# Patient Record
Sex: Male | Born: 1940 | Race: White | Hispanic: No | Marital: Married | Smoking: Former smoker
Health system: Southern US, Community
[De-identification: ages and names within clinical notes are randomized; demographics above are authoritative.]

## PROBLEM LIST (undated history)

## (undated) DIAGNOSIS — F419 Anxiety disorder, unspecified: Secondary | ICD-10-CM

## (undated) DIAGNOSIS — I739 Peripheral vascular disease, unspecified: Secondary | ICD-10-CM

## (undated) DIAGNOSIS — I209 Angina pectoris, unspecified: Secondary | ICD-10-CM

## (undated) DIAGNOSIS — E7800 Pure hypercholesterolemia, unspecified: Secondary | ICD-10-CM

## (undated) DIAGNOSIS — C801 Malignant (primary) neoplasm, unspecified: Secondary | ICD-10-CM

## (undated) DIAGNOSIS — IMO0001 Reserved for inherently not codable concepts without codable children: Secondary | ICD-10-CM

## (undated) DIAGNOSIS — H9190 Unspecified hearing loss, unspecified ear: Secondary | ICD-10-CM

## (undated) DIAGNOSIS — E119 Type 2 diabetes mellitus without complications: Secondary | ICD-10-CM

## (undated) DIAGNOSIS — I10 Essential (primary) hypertension: Secondary | ICD-10-CM

## (undated) DIAGNOSIS — I2510 Atherosclerotic heart disease of native coronary artery without angina pectoris: Secondary | ICD-10-CM

## (undated) HISTORY — PX: MOUTH SURGERY: SHX715

## (undated) HISTORY — PX: LEG TENDON SURGERY: SHX1004

## (undated) HISTORY — PX: APPENDECTOMY: SHX54

## (undated) HISTORY — PX: CARDIAC CATHETERIZATION: SHX172

## (undated) HISTORY — PX: HERNIA REPAIR: SHX51

---

## 2006-08-25 ENCOUNTER — Ambulatory Visit: Payer: Self-pay | Admitting: Internal Medicine

## 2015-04-11 DIAGNOSIS — H2513 Age-related nuclear cataract, bilateral: Secondary | ICD-10-CM | POA: Diagnosis not present

## 2015-06-05 DIAGNOSIS — Z85828 Personal history of other malignant neoplasm of skin: Secondary | ICD-10-CM | POA: Diagnosis not present

## 2015-06-05 DIAGNOSIS — L821 Other seborrheic keratosis: Secondary | ICD-10-CM | POA: Diagnosis not present

## 2015-06-05 DIAGNOSIS — D225 Melanocytic nevi of trunk: Secondary | ICD-10-CM | POA: Diagnosis not present

## 2015-06-05 DIAGNOSIS — Z08 Encounter for follow-up examination after completed treatment for malignant neoplasm: Secondary | ICD-10-CM | POA: Diagnosis not present

## 2015-06-05 DIAGNOSIS — B351 Tinea unguium: Secondary | ICD-10-CM | POA: Diagnosis not present

## 2015-06-05 DIAGNOSIS — L700 Acne vulgaris: Secondary | ICD-10-CM | POA: Diagnosis not present

## 2015-06-05 DIAGNOSIS — L219 Seborrheic dermatitis, unspecified: Secondary | ICD-10-CM | POA: Diagnosis not present

## 2015-06-05 DIAGNOSIS — H2512 Age-related nuclear cataract, left eye: Secondary | ICD-10-CM | POA: Diagnosis not present

## 2015-06-10 ENCOUNTER — Encounter: Payer: Self-pay | Admitting: *Deleted

## 2015-06-17 ENCOUNTER — Encounter
Admission: RE | Disposition: A | Discharge status: Home / Self Care | Payer: Self-pay | Source: Ambulatory Visit | Attending: Ophthalmology

## 2015-06-17 ENCOUNTER — Ambulatory Visit: Payer: Medicare Other | Admitting: Anesthesiology

## 2015-06-17 ENCOUNTER — Encounter: Payer: Self-pay | Admitting: *Deleted

## 2015-06-17 ENCOUNTER — Ambulatory Visit
Admission: RE | Admit: 2015-06-17 | Discharge: 2015-06-17 | Disposition: A | Discharge status: Home / Self Care | Payer: Medicare Other | Source: Ambulatory Visit | Attending: Ophthalmology | Admitting: Ophthalmology

## 2015-06-17 DIAGNOSIS — F419 Anxiety disorder, unspecified: Secondary | ICD-10-CM | POA: Diagnosis not present

## 2015-06-17 DIAGNOSIS — Z87891 Personal history of nicotine dependence: Secondary | ICD-10-CM | POA: Diagnosis not present

## 2015-06-17 DIAGNOSIS — E7800 Pure hypercholesterolemia, unspecified: Secondary | ICD-10-CM | POA: Insufficient documentation

## 2015-06-17 DIAGNOSIS — E119 Type 2 diabetes mellitus without complications: Secondary | ICD-10-CM | POA: Diagnosis not present

## 2015-06-17 DIAGNOSIS — I10 Essential (primary) hypertension: Secondary | ICD-10-CM | POA: Diagnosis not present

## 2015-06-17 DIAGNOSIS — H9193 Unspecified hearing loss, bilateral: Secondary | ICD-10-CM | POA: Diagnosis not present

## 2015-06-17 DIAGNOSIS — I739 Peripheral vascular disease, unspecified: Secondary | ICD-10-CM | POA: Diagnosis not present

## 2015-06-17 DIAGNOSIS — H2512 Age-related nuclear cataract, left eye: Secondary | ICD-10-CM | POA: Insufficient documentation

## 2015-06-17 DIAGNOSIS — I2510 Atherosclerotic heart disease of native coronary artery without angina pectoris: Secondary | ICD-10-CM | POA: Insufficient documentation

## 2015-06-17 HISTORY — DX: Peripheral vascular disease, unspecified: I73.9

## 2015-06-17 HISTORY — DX: Atherosclerotic heart disease of native coronary artery without angina pectoris: I25.10

## 2015-06-17 HISTORY — DX: Unspecified hearing loss, unspecified ear: H91.90

## 2015-06-17 HISTORY — DX: Malignant (primary) neoplasm, unspecified: C80.1

## 2015-06-17 HISTORY — DX: Anxiety disorder, unspecified: F41.9

## 2015-06-17 HISTORY — DX: Pure hypercholesterolemia, unspecified: E78.00

## 2015-06-17 HISTORY — DX: Essential (primary) hypertension: I10

## 2015-06-17 HISTORY — DX: Angina pectoris, unspecified: I20.9

## 2015-06-17 HISTORY — DX: Reserved for inherently not codable concepts without codable children: IMO0001

## 2015-06-17 HISTORY — DX: Type 2 diabetes mellitus without complications: E11.9

## 2015-06-17 HISTORY — PX: CATARACT EXTRACTION W/PHACO: SHX586

## 2015-06-17 LAB — GLUCOSE, CAPILLARY: GLUCOSE-CAPILLARY: 96 mg/dL (ref 65–99)

## 2015-06-17 SURGERY — PHACOEMULSIFICATION, CATARACT, WITH IOL INSERTION
Anesthesia: Monitor Anesthesia Care | Site: Eye | Laterality: Left | Wound class: Clean

## 2015-06-17 MED ORDER — POVIDONE-IODINE 5 % OP SOLN
1.0000 "application " | Freq: Once | OPHTHALMIC | Status: AC
  Administered 2015-06-17: 1 via OPHTHALMIC

## 2015-06-17 MED ORDER — POVIDONE-IODINE 5 % OP SOLN
OPHTHALMIC | Status: AC
  Filled 2015-06-17: qty 30

## 2015-06-17 MED ORDER — CARBACHOL 0.01 % IO SOLN
INTRAOCULAR | Status: DC | PRN
  Administered 2015-06-17: 0.5 mL via INTRAOCULAR

## 2015-06-17 MED ORDER — MOXIFLOXACIN HCL 0.5 % OP SOLN
OPHTHALMIC | Status: AC
  Filled 2015-06-17: qty 3

## 2015-06-17 MED ORDER — ARMC OPHTHALMIC DILATING GEL
1.0000 "application " | Freq: Once | OPHTHALMIC | Status: AC
  Administered 2015-06-17: 1 via OPHTHALMIC

## 2015-06-17 MED ORDER — NA CHONDROIT SULF-NA HYALURON 40-17 MG/ML IO SOLN
INTRAOCULAR | Status: AC
  Filled 2015-06-17: qty 1

## 2015-06-17 MED ORDER — MIDAZOLAM HCL 2 MG/2ML IJ SOLN
INTRAMUSCULAR | Status: DC | PRN
  Administered 2015-06-17: 1 mg via INTRAVENOUS

## 2015-06-17 MED ORDER — EPINEPHRINE HCL 1 MG/ML IJ SOLN
INTRAMUSCULAR | Status: AC
  Filled 2015-06-17: qty 1

## 2015-06-17 MED ORDER — FENTANYL CITRATE (PF) 100 MCG/2ML IJ SOLN
INTRAMUSCULAR | Status: DC | PRN
  Administered 2015-06-17: 50 ug via INTRAVENOUS

## 2015-06-17 MED ORDER — CEFUROXIME OPHTHALMIC INJECTION 1 MG/0.1 ML
INJECTION | OPHTHALMIC | Status: AC
  Filled 2015-06-17: qty 0.1

## 2015-06-17 MED ORDER — NA CHONDROIT SULF-NA HYALURON 40-17 MG/ML IO SOLN
INTRAOCULAR | Status: DC | PRN
  Administered 2015-06-17: 1 mL via INTRAOCULAR

## 2015-06-17 MED ORDER — CEFUROXIME OPHTHALMIC INJECTION 1 MG/0.1 ML
INJECTION | OPHTHALMIC | Status: DC | PRN
  Administered 2015-06-17: 0.1 mL via INTRACAMERAL

## 2015-06-17 MED ORDER — EPINEPHRINE HCL 1 MG/ML IJ SOLN
INTRAOCULAR | Status: DC | PRN
  Administered 2015-06-17: 09:00:00 via OPHTHALMIC

## 2015-06-17 MED ORDER — TETRACAINE HCL 0.5 % OP SOLN
OPHTHALMIC | Status: AC
  Filled 2015-06-17: qty 2

## 2015-06-17 MED ORDER — SODIUM CHLORIDE 0.9 % IV SOLN
INTRAVENOUS | Status: DC
  Administered 2015-06-17 (×2): via INTRAVENOUS

## 2015-06-17 MED ORDER — TETRACAINE HCL 0.5 % OP SOLN
1.0000 [drp] | Freq: Once | OPHTHALMIC | Status: AC
  Administered 2015-06-17: 1 [drp] via OPHTHALMIC

## 2015-06-17 MED ORDER — MOXIFLOXACIN HCL 0.5 % OP SOLN
OPHTHALMIC | Status: DC | PRN
  Administered 2015-06-17: 1 [drp] via OPHTHALMIC

## 2015-06-17 MED ORDER — MOXIFLOXACIN HCL 0.5 % OP SOLN: 1.0000 [drp] | OPHTHALMIC | Status: DC | PRN

## 2015-06-17 SURGICAL SUPPLY — 21 items
CANNULA ANT/CHMB 27GA (MISCELLANEOUS) ×3 IMPLANT
CUP MEDICINE 2OZ PLAST GRAD ST (MISCELLANEOUS) ×3 IMPLANT
GLOVE BIO SURGEON STRL SZ8 (GLOVE) ×3 IMPLANT
GLOVE BIOGEL M 6.5 STRL (GLOVE) ×3 IMPLANT
GLOVE SURG LX 8.0 MICRO (GLOVE) ×2
GLOVE SURG LX STRL 8.0 MICRO (GLOVE) ×1 IMPLANT
GOWN STRL REUS W/ TWL LRG LVL3 (GOWN DISPOSABLE) ×2 IMPLANT
GOWN STRL REUS W/TWL LRG LVL3 (GOWN DISPOSABLE) ×4
LENS IOL TECNIS ITEC 21.0 (Intraocular Lens) ×3 IMPLANT
PACK CATARACT (MISCELLANEOUS) ×3 IMPLANT
PACK CATARACT BRASINGTON LX (MISCELLANEOUS) ×3 IMPLANT
PACK EYE AFTER SURG (MISCELLANEOUS) ×3 IMPLANT
SOL BSS BAG (MISCELLANEOUS) ×3
SOL PREP PVP 2OZ (MISCELLANEOUS) ×3
SOLUTION BSS BAG (MISCELLANEOUS) ×1 IMPLANT
SOLUTION PREP PVP 2OZ (MISCELLANEOUS) ×1 IMPLANT
SYR 3ML LL SCALE MARK (SYRINGE) ×3 IMPLANT
SYR 5ML LL (SYRINGE) ×3 IMPLANT
SYR TB 1ML 27GX1/2 LL (SYRINGE) ×3 IMPLANT
WATER STERILE IRR 1000ML POUR (IV SOLUTION) ×3 IMPLANT
WIPE NON LINTING 3.25X3.25 (MISCELLANEOUS) ×3 IMPLANT

## 2015-06-17 NOTE — Op Note (Signed)
PREOPERATIVE DIAGNOSIS:  Nuclear sclerotic cataract of the left eye.   POSTOPERATIVE DIAGNOSIS:  NUCLEAR SCLEROTIC CATARACT LEFT EYE   OPERATIVE PROCEDURE:  Procedure(s): CATARACT EXTRACTION PHACO AND INTRAOCULAR LENS PLACEMENT (IOC)   SURGEON:  Birder Robson, MD.   ANESTHESIA:   Anesthesiologist: Andria Frames, MD CRNA: Jenetta Downer, CRNA; Aline Brochure, CRNA  1.      Managed anesthesia care. 2.      Topical tetracaine drops followed by 2% Xylocaine jelly applied in the preoperative holding area.   COMPLICATIONS:  None.   TECHNIQUE:   Stop and chop   DESCRIPTION OF PROCEDURE:  The patient was examined and consented in the preoperative holding area where the aforementioned topical anesthesia was applied to the left eye and then brought back to the Operating Room where the left eye was prepped and draped in the usual sterile ophthalmic fashion and a lid speculum was placed. A paracentesis was created with the side port blade and the anterior chamber was filled with viscoelastic. A near clear corneal incision was performed with the steel keratome. A continuous curvilinear capsulorrhexis was performed with a cystotome followed by the capsulorrhexis forceps. Hydrodissection and hydrodelineation were carried out with BSS on a blunt cannula. The lens was removed in a stop and chop  technique and the remaining cortical material was removed with the irrigation-aspiration handpiece. The capsular bag was inflated with viscoelastic and the Technis [id number] lens was placed in the capsular bag without complication. The remaining viscoelastic was removed from the eye with the irrigation-aspiration handpiece. The wounds were hydrated. The anterior chamber was flushed with Miostat and the eye was inflated to physiologic pressure. 0.1 mL of cefuroxime concentration 10 mg/mL was placed in the anterior chamber. The wounds were found to be water tight. The eye was dressed with Vigamox. The  patient was given protective glasses to wear throughout the day and a shield with which to sleep tonight. The patient was also given drops with which to begin a drop regimen today and will follow-up with me in one day.  Implant Name Type Inv. Item Serial No. Manufacturer Lot No. LRB No. Used  Aspheric IOL     [id number] [facility]   Left 1   Procedure(s) with comments: CATARACT EXTRACTION PHACO AND INTRAOCULAR LENS PLACEMENT (IOC) (Left) - [place] 47.5 AP% 20.9 CDE 9.92 Fluid Pack lot # [id number]  Electronically signed: [place] 06/17/2015 9:13 AM

## 2015-06-17 NOTE — Anesthesia Postprocedure Evaluation (Signed)
Anesthesia Post Note  Patient: Aaron HOLZWORTH Sr.  Procedure(s) Performed: Procedure(s) (LRB): CATARACT EXTRACTION PHACO AND INTRAOCULAR LENS PLACEMENT (IOC) (Left)  Patient location during evaluation: PACU Anesthesia Type: MAC Level of consciousness: awake and alert and oriented Pain management: pain level controlled Vital Signs Assessment: post-procedure vital signs reviewed and stable Respiratory status: spontaneous breathing Cardiovascular status: stable Anesthetic complications: no    Last Vitals:  Filed Vitals:   06/17/15 0745 06/17/15 0914  BP: 131/76   Pulse: 67 59  Temp: 37.1 C 36.6 C  Resp: 16 16    Last Pain: There were no vitals filed for this visit.               Estill Batten

## 2015-06-17 NOTE — Transfer of Care (Signed)
Immediate Anesthesia Transfer of Care Note  Patient: Aaron Garcia.  Procedure(s) Performed: Procedure(s) with comments: CATARACT EXTRACTION PHACO AND INTRAOCULAR LENS PLACEMENT (IOC) (Left) - [place] 47.5 AP% 20.9 CDE 9.92 Fluid Pack lot # [id number]  Patient Location: PACU  Anesthesia Type:MAC  Level of Consciousness: awake, alert  and oriented  Airway & Oxygen Therapy: Patient Spontanous Breathing  Post-op Assessment: Report given to RN  Post vital signs: Reviewed and stable  Last Vitals:  Filed Vitals:   06/17/15 0745  BP: 131/76  Pulse: 67  Temp: 37.1 C  Resp: 16    Last Pain: There were no vitals filed for this visit.       Complications: No apparent anesthesia complications

## 2015-06-17 NOTE — Discharge Instructions (Signed)
Eye Surgery Discharge Instructions  Expect mild scratchy sensation or mild soreness. DO NOT RUB YOUR EYE!  The day of surgery:  Minimal physical activity, but bed rest is not required  No reading, computer work, or close hand work  No bending, lifting, or straining.  May watch TV  For 24 hours:  No driving, legal decisions, or alcoholic beverages  Safety precautions  Eat anything you prefer: It is better to start with liquids, then soup then solid foods.  _____ Eye patch should be worn until postoperative exam tomorrow.  ____ Solar shield eyeglasses should be worn for comfort in the sunlight/patch while sleeping  Resume all regular medications including aspirin or Coumadin if these were discontinued prior to surgery. You may shower, bathe, shave, or wash your hair. Tylenol may be taken for mild discomfort.  Call your doctor if you experience significant pain, nausea, or vomiting, fever > 101 or other signs of infection. [phone] or [phone] Specific instructions:  Follow-up Information    Follow up with Tim Lair, MD On 06/18/2015.   Specialty:  Ophthalmology   Why:  [address] information:   [address] [place] [address] [phone]

## 2015-06-17 NOTE — H&P (Signed)
  All labs reviewed. Abnormal studies sent to patients PCP when indicated.  Previous H&P reviewed, patient examined, there are NO CHANGES.  Aaron Garcia LOUIS5/30/20178:46 AM

## 2015-06-17 NOTE — Anesthesia Preprocedure Evaluation (Signed)
Anesthesia Evaluation  Patient identified by MRN, date of birth, ID band Patient awake    Reviewed: Allergy & Precautions, H&P , NPO status , Patient's Chart, lab work & pertinent test results  History of Anesthesia Complications Negative for: history of anesthetic complications  Airway Mallampati: III  TM Distance: >3 FB Neck ROM: limited    Dental  (+) Poor Dentition, Chipped, Missing, Upper Dentures   Pulmonary shortness of breath and with exertion, former smoker,    Pulmonary exam normal breath sounds clear to auscultation       Cardiovascular Exercise Tolerance: Poor hypertension, + angina + CAD, + Peripheral Vascular Disease and + DOE  Normal cardiovascular examII Rhythm:regular Rate:Normal     Neuro/Psych negative neurological ROS  negative psych ROS   GI/Hepatic negative GI ROS, Neg liver ROS, neg GERD  ,  Endo/Other  diabetes, Type 2  Renal/GU negative Renal ROS  negative genitourinary   Musculoskeletal   Abdominal   Peds  Hematology negative hematology ROS (+)   Anesthesia Other Findings Past Medical History:   Coronary artery disease                                      Hypertension                                                 Hypercholesteremia                                           Cancer (HCC)                                                   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   Peripheral vascular disease (HCC)                            Diabetes mellitus without complication (HCC)                 HOH (hard of hearing)                                          Comment:wears hearing aids   Anxiety                                                      Anginal pain (HCC)                                           Shortness of breath dyspnea  Comment:orthopnea uses 2 pillows  Past Surgical History:   CARDIAC CATHETERIZATION                                      Comment:No Stents   HERNIA REPAIR                                                 APPENDECTOMY                                                  MOUTH SURGERY                                                 LEG TENDON SURGERY                                           BMI    Body Mass Index   27.97 kg/m 2      Reproductive/Obstetrics negative OB ROS                             Anesthesia Physical Anesthesia Plan  ASA: III  Anesthesia Plan: MAC   Post-op Pain Management:    Induction:   Airway Management Planned:   Additional Equipment:   Intra-op Plan:   Post-operative Plan:   Informed Consent: I have reviewed the patients History and Physical, chart, labs and discussed the procedure including the risks, benefits and alternatives for the proposed anesthesia with the patient or authorized representative who has indicated his/her understanding and acceptance.   Dental Advisory Given  Plan Discussed with: Anesthesiologist, CRNA and Surgeon  Anesthesia Plan Comments:         Anesthesia Quick Evaluation

## 2015-07-07 DIAGNOSIS — H2511 Age-related nuclear cataract, right eye: Secondary | ICD-10-CM | POA: Diagnosis not present

## 2015-07-08 ENCOUNTER — Encounter: Payer: Self-pay | Admitting: *Deleted

## 2015-07-15 ENCOUNTER — Ambulatory Visit
Admission: RE | Admit: 2015-07-15 | Discharge: 2015-07-15 | Disposition: A | Discharge status: Home / Self Care | Payer: Medicare Other | Source: Ambulatory Visit | Attending: Ophthalmology | Admitting: Ophthalmology

## 2015-07-15 ENCOUNTER — Ambulatory Visit: Payer: Medicare Other | Admitting: Anesthesiology

## 2015-07-15 ENCOUNTER — Encounter: Payer: Self-pay | Admitting: *Deleted

## 2015-07-15 ENCOUNTER — Encounter
Admission: RE | Disposition: A | Discharge status: Home / Self Care | Payer: Self-pay | Source: Ambulatory Visit | Attending: Ophthalmology

## 2015-07-15 DIAGNOSIS — F419 Anxiety disorder, unspecified: Secondary | ICD-10-CM | POA: Diagnosis not present

## 2015-07-15 DIAGNOSIS — I739 Peripheral vascular disease, unspecified: Secondary | ICD-10-CM | POA: Insufficient documentation

## 2015-07-15 DIAGNOSIS — E119 Type 2 diabetes mellitus without complications: Secondary | ICD-10-CM | POA: Insufficient documentation

## 2015-07-15 DIAGNOSIS — I10 Essential (primary) hypertension: Secondary | ICD-10-CM | POA: Diagnosis not present

## 2015-07-15 DIAGNOSIS — H2511 Age-related nuclear cataract, right eye: Secondary | ICD-10-CM | POA: Insufficient documentation

## 2015-07-15 DIAGNOSIS — Z87891 Personal history of nicotine dependence: Secondary | ICD-10-CM | POA: Diagnosis not present

## 2015-07-15 DIAGNOSIS — I2510 Atherosclerotic heart disease of native coronary artery without angina pectoris: Secondary | ICD-10-CM | POA: Insufficient documentation

## 2015-07-15 DIAGNOSIS — R0602 Shortness of breath: Secondary | ICD-10-CM | POA: Diagnosis not present

## 2015-07-15 HISTORY — PX: CATARACT EXTRACTION W/PHACO: SHX586

## 2015-07-15 SURGERY — PHACOEMULSIFICATION, CATARACT, WITH IOL INSERTION
Anesthesia: Monitor Anesthesia Care | Site: Eye | Laterality: Right | Wound class: Clean

## 2015-07-15 MED ORDER — CEFUROXIME OPHTHALMIC INJECTION 1 MG/0.1 ML
INJECTION | OPHTHALMIC | Status: AC
  Filled 2015-07-15: qty 0.1

## 2015-07-15 MED ORDER — CEFUROXIME OPHTHALMIC INJECTION 1 MG/0.1 ML
INJECTION | OPHTHALMIC | Status: DC | PRN
  Administered 2015-07-15: .1 mL via INTRACAMERAL

## 2015-07-15 MED ORDER — TETRACAINE HCL 0.5 % OP SOLN
1.0000 [drp] | Freq: Once | OPHTHALMIC | Status: AC
  Administered 2015-07-15: 1 [drp] via OPHTHALMIC

## 2015-07-15 MED ORDER — POVIDONE-IODINE 5 % OP SOLN
OPHTHALMIC | Status: AC
  Filled 2015-07-15: qty 30

## 2015-07-15 MED ORDER — ALFENTANIL 500 MCG/ML IJ INJ
INJECTION | INTRAMUSCULAR | Status: DC | PRN
  Administered 2015-07-15: 500 ug via INTRAVENOUS

## 2015-07-15 MED ORDER — EPINEPHRINE HCL 1 MG/ML IJ SOLN
INTRAMUSCULAR | Status: AC
  Filled 2015-07-15: qty 1

## 2015-07-15 MED ORDER — CARBACHOL 0.01 % IO SOLN
INTRAOCULAR | Status: DC | PRN
  Administered 2015-07-15: .5 mL via INTRAOCULAR

## 2015-07-15 MED ORDER — NA CHONDROIT SULF-NA HYALURON 40-17 MG/ML IO SOLN
INTRAOCULAR | Status: DC | PRN
  Administered 2015-07-15: 1 mL via INTRAOCULAR

## 2015-07-15 MED ORDER — MOXIFLOXACIN HCL 0.5 % OP SOLN: 1.0000 [drp] | OPHTHALMIC | Status: DC | PRN

## 2015-07-15 MED ORDER — TETRACAINE HCL 0.5 % OP SOLN
OPHTHALMIC | Status: AC
  Filled 2015-07-15: qty 2

## 2015-07-15 MED ORDER — MIDAZOLAM HCL 2 MG/2ML IJ SOLN
INTRAMUSCULAR | Status: DC | PRN
  Administered 2015-07-15 (×2): 0.5 mg via INTRAVENOUS

## 2015-07-15 MED ORDER — MOXIFLOXACIN HCL 0.5 % OP SOLN
OPHTHALMIC | Status: AC
  Filled 2015-07-15: qty 3

## 2015-07-15 MED ORDER — POVIDONE-IODINE 5 % OP SOLN
OPHTHALMIC | Status: DC | PRN
  Administered 2015-07-15: 1 via OPHTHALMIC

## 2015-07-15 MED ORDER — SODIUM CHLORIDE 0.9 % IV SOLN
INTRAVENOUS | Status: DC
Start: 2015-07-15 — End: 2015-07-15
  Administered 2015-07-15: 10:00:00 via INTRAVENOUS

## 2015-07-15 MED ORDER — BSS IO SOLN
INTRAOCULAR | Status: DC | PRN
  Administered 2015-07-15: 1 mL via OPHTHALMIC

## 2015-07-15 MED ORDER — ARMC OPHTHALMIC DILATING GEL
OPHTHALMIC | Status: AC
  Filled 2015-07-15: qty 0.25

## 2015-07-15 MED ORDER — ARMC OPHTHALMIC DILATING GEL
1.0000 "application " | OPHTHALMIC | Status: AC
  Administered 2015-07-15: 1 via OPHTHALMIC

## 2015-07-15 MED ORDER — POVIDONE-IODINE 5 % OP SOLN
1.0000 "application " | Freq: Once | OPHTHALMIC | Status: AC
  Administered 2015-07-15: 1 via OPHTHALMIC

## 2015-07-15 MED ORDER — MOXIFLOXACIN HCL 0.5 % OP SOLN
OPHTHALMIC | Status: DC | PRN
  Administered 2015-07-15: 1 [drp] via OPHTHALMIC

## 2015-07-15 MED ORDER — NA CHONDROIT SULF-NA HYALURON 40-17 MG/ML IO SOLN
INTRAOCULAR | Status: AC
Start: 2015-07-15 — End: 2015-07-15
  Filled 2015-07-15: qty 1

## 2015-07-15 SURGICAL SUPPLY — 21 items
CANNULA ANT/CHMB 27GA (MISCELLANEOUS) ×3 IMPLANT
CUP MEDICINE 2OZ PLAST GRAD ST (MISCELLANEOUS) ×3 IMPLANT
GLOVE BIO SURGEON STRL SZ8 (GLOVE) ×3 IMPLANT
GLOVE BIOGEL M 6.5 STRL (GLOVE) ×3 IMPLANT
GLOVE SURG LX 8.0 MICRO (GLOVE) ×2
GLOVE SURG LX STRL 8.0 MICRO (GLOVE) ×1 IMPLANT
GOWN STRL REUS W/ TWL LRG LVL3 (GOWN DISPOSABLE) ×2 IMPLANT
GOWN STRL REUS W/TWL LRG LVL3 (GOWN DISPOSABLE) ×4
LENS IOL TECNIS ITEC 20.0 (Intraocular Lens) ×3 IMPLANT
PACK CATARACT (MISCELLANEOUS) ×3 IMPLANT
PACK CATARACT BRASINGTON LX (MISCELLANEOUS) ×3 IMPLANT
PACK EYE AFTER SURG (MISCELLANEOUS) ×3 IMPLANT
SOL BSS BAG (MISCELLANEOUS) ×3
SOL PREP PVP 2OZ (MISCELLANEOUS) ×3
SOLUTION BSS BAG (MISCELLANEOUS) ×1 IMPLANT
SOLUTION PREP PVP 2OZ (MISCELLANEOUS) ×1 IMPLANT
SYR 3ML LL SCALE MARK (SYRINGE) ×3 IMPLANT
SYR 5ML LL (SYRINGE) ×3 IMPLANT
SYR TB 1ML 27GX1/2 LL (SYRINGE) ×3 IMPLANT
WATER STERILE IRR 1000ML POUR (IV SOLUTION) ×3 IMPLANT
WIPE NON LINTING 3.25X3.25 (MISCELLANEOUS) ×3 IMPLANT

## 2015-07-15 NOTE — Discharge Instructions (Signed)
Eye Surgery Discharge Instructions  Expect mild scratchy sensation or mild soreness. DO NOT RUB YOUR EYE!  The day of surgery:  Minimal physical activity, but bed rest is not required  No reading, computer work, or close hand work  No bending, lifting, or straining.  May watch TV  For 24 hours:  No driving, legal decisions, or alcoholic beverages  Safety precautions  Eat anything you prefer: It is better to start with liquids, then soup then solid foods.  _____ Eye patch should be worn until postoperative exam tomorrow.  ____ Solar shield eyeglasses should be worn for comfort in the sunlight/patch while sleeping  Resume all regular medications including aspirin or Coumadin if these were discontinued prior to surgery. You may shower, bathe, shave, or wash your hair. Tylenol may be taken for mild discomfort.  Call your doctor if you experience significant pain, nausea, or vomiting, fever > 101 or other signs of infection. [phone] or [phone] Specific instructions:  Follow-up Information    Follow up with Tim Lair, MD On 07/16/2015.   Specialty:  Ophthalmology   Why:  10:55   Contact information:   [address] [place] [address] [phone]

## 2015-07-15 NOTE — Anesthesia Postprocedure Evaluation (Signed)
Anesthesia Post Note  Patient: Aaron PERDEW Sr.  Procedure(s) Performed: Procedure(s) (LRB): CATARACT EXTRACTION PHACO AND INTRAOCULAR LENS PLACEMENT (IOC) (Right)  Patient location during evaluation: Other Anesthesia Type: General Level of consciousness: awake and alert Pain management: pain level controlled Vital Signs Assessment: post-procedure vital signs reviewed and stable Respiratory status: spontaneous breathing, nonlabored ventilation, respiratory function stable and patient connected to nasal cannula oxygen Cardiovascular status: blood pressure returned to baseline and stable Postop Assessment: no signs of nausea or vomiting Anesthetic complications: no    Last Vitals:  Filed Vitals:   07/15/15 1022 07/15/15 1034  BP: 118/63 110/60  Pulse: 60 62  Temp: 36.5 C   Resp:  16    Last Pain: There were no vitals filed for this visit.               Tzivia Oneil S

## 2015-07-15 NOTE — H&P (Signed)
  All labs reviewed. Abnormal studies sent to patients PCP when indicated.  Previous H&P reviewed, patient examined, there are NO CHANGES.  Joni Colegrove LOUIS6/27/20179:54 AM

## 2015-07-15 NOTE — Op Note (Signed)
PREOPERATIVE DIAGNOSIS:  Nuclear sclerotic cataract of the right eye.   POSTOPERATIVE DIAGNOSIS: nuclear sclerotic cataract right eye   OPERATIVE PROCEDURE:  Procedure(s): CATARACT EXTRACTION PHACO AND INTRAOCULAR LENS PLACEMENT (IOC)   SURGEON:  Birder Robson, MD.   ANESTHESIA:  Anesthesiologist: Gunnar Bulla, MD CRNA: Courtney Paris, CRNA  1.      Managed anesthesia care. 2.      Topical tetracaine drops followed by 2% Xylocaine jelly applied in the preoperative holding area.   COMPLICATIONS:  None.   TECHNIQUE:   Stop and chop   DESCRIPTION OF PROCEDURE:  The patient was examined and consented in the preoperative holding area where the aforementioned topical anesthesia was applied to the right eye and then brought back to the Operating Room where the right eye was prepped and draped in the usual sterile ophthalmic fashion and a lid speculum was placed. A paracentesis was created with the side port blade and the anterior chamber was filled with viscoelastic. A near clear corneal incision was performed with the steel keratome. A continuous curvilinear capsulorrhexis was performed with a cystotome followed by the capsulorrhexis forceps. Hydrodissection and hydrodelineation were carried out with BSS on a blunt cannula. The lens was removed in a stop and chop  technique and the remaining cortical material was removed with the irrigation-aspiration handpiece. The capsular bag was inflated with viscoelastic and the Technis [id number]  lens was placed in the capsular bag without complication. The remaining viscoelastic was removed from the eye with the irrigation-aspiration handpiece. The wounds were hydrated. The anterior chamber was flushed with Miostat and the eye was inflated to physiologic pressure. 0.1 mL of cefuroxime concentration 10 mg/mL was placed in the anterior chamber. The wounds were found to be water tight. The eye was dressed with Vigamox. The patient was given protective glasses to  wear throughout the day and a shield with which to sleep tonight. The patient was also given drops with which to begin a drop regimen today and will follow-up with me in one day.  Implant Name Type Inv. Item Serial No. Manufacturer Lot No. LRB No. Used  LENS IOL DIOP 20.0 - [id number] Intraocular Lens LENS IOL DIOP 20.0 [id number]   Right 1   Procedure(s) with comments: CATARACT EXTRACTION PHACO AND INTRAOCULAR LENS PLACEMENT (IOC) (Right) - [place] 01:06 AP% 19.5 CDE 13.04 fluid pack lot # [id number]  Electronically signed: [place] 07/15/2015 10:19 AM

## 2015-07-15 NOTE — Transfer of Care (Signed)
Immediate Anesthesia Transfer of Care Note  Patient: Aaron BECHERER Sr.  Procedure(s) Performed: Procedure(s) with comments: CATARACT EXTRACTION PHACO AND INTRAOCULAR LENS PLACEMENT (IOC) (Right) - [place] 01:06 AP% 19.5 CDE 13.04 fluid pack lot # [id number]  Patient Location: PACU and Short Stay  Anesthesia Type:MAC  Level of Consciousness: awake, oriented and patient cooperative  Airway & Oxygen Therapy: Patient Spontanous Breathing  Post-op Assessment: Report given to RN and Post -op Vital signs reviewed and stable  Post vital signs: stable  Last Vitals:  Filed Vitals:   07/15/15 0842 07/15/15 0847  BP: 121/70   Pulse: 69   Temp:  35.9 C  Resp: 16     Last Pain: There were no vitals filed for this visit.       Complications: No apparent anesthesia complications

## 2015-07-15 NOTE — Anesthesia Preprocedure Evaluation (Addendum)
Anesthesia Evaluation  Patient identified by MRN, date of birth, ID band Patient awake    Reviewed: Allergy & Precautions, NPO status , Patient's Chart, lab work & pertinent test results, reviewed documented beta blocker date and time   Airway Mallampati: II  TM Distance: >3 FB     Dental  (+) Chipped   Pulmonary shortness of breath, former smoker,           Cardiovascular hypertension, Pt. on medications and Pt. on home beta blockers + angina + CAD and + Peripheral Vascular Disease       Neuro/Psych Anxiety    GI/Hepatic   Endo/Other  diabetes, Type 2  Renal/GU      Musculoskeletal   Abdominal   Peds  Hematology   Anesthesia Other Findings Active man, does not take NTG. No MIs. No stents. Dentures at bottom can be removed, upper is fixed.  Reproductive/Obstetrics                          Anesthesia Physical Anesthesia Plan  ASA: III  Anesthesia Plan: MAC   Post-op Pain Management:    Induction:   Airway Management Planned:   Additional Equipment:   Intra-op Plan:   Post-operative Plan:   Informed Consent: I have reviewed the patients History and Physical, chart, labs and discussed the procedure including the risks, benefits and alternatives for the proposed anesthesia with the patient or authorized representative who has indicated his/her understanding and acceptance.     Plan Discussed with: CRNA  Anesthesia Plan Comments:         Anesthesia Quick Evaluation

## 2015-08-11 DIAGNOSIS — H6120 Impacted cerumen, unspecified ear: Secondary | ICD-10-CM | POA: Diagnosis not present

## 2016-03-26 DIAGNOSIS — Z961 Presence of intraocular lens: Secondary | ICD-10-CM | POA: Diagnosis not present

## 2016-06-03 DIAGNOSIS — D225 Melanocytic nevi of trunk: Secondary | ICD-10-CM | POA: Diagnosis not present

## 2016-06-03 DIAGNOSIS — Z08 Encounter for follow-up examination after completed treatment for malignant neoplasm: Secondary | ICD-10-CM | POA: Diagnosis not present

## 2016-06-03 DIAGNOSIS — Z85828 Personal history of other malignant neoplasm of skin: Secondary | ICD-10-CM | POA: Diagnosis not present

## 2016-06-03 DIAGNOSIS — L720 Epidermal cyst: Secondary | ICD-10-CM | POA: Diagnosis not present

## 2016-06-03 DIAGNOSIS — L821 Other seborrheic keratosis: Secondary | ICD-10-CM | POA: Diagnosis not present

## 2016-10-27 ENCOUNTER — Ambulatory Visit
Admission: EM | Admit: 2016-10-27 | Discharge: 2016-10-27 | Disposition: A | Discharge status: Home / Self Care | Payer: Medicare Other | Attending: Emergency Medicine | Admitting: Emergency Medicine

## 2016-10-27 ENCOUNTER — Encounter: Payer: Self-pay | Admitting: *Deleted

## 2016-10-27 ENCOUNTER — Ambulatory Visit: Payer: Medicare Other

## 2016-10-27 DIAGNOSIS — M25532 Pain in left wrist: Secondary | ICD-10-CM

## 2016-10-27 DIAGNOSIS — M19032 Primary osteoarthritis, left wrist: Secondary | ICD-10-CM | POA: Insufficient documentation

## 2016-10-27 NOTE — Discharge Instructions (Addendum)
Your xray was negative for fracuture, wear wrist splint for comfort. Follow up with PCP in 1 week, sooner if worse. May need follow up with Orthopedist if s/s persist. Call Orthopedist of your choice or you may check with your insurance company. Rest,ice,elevate. Return to UC as needed. May use OTC tylenol for pain as label directed.

## 2016-10-27 NOTE — ED Triage Notes (Signed)
Patient started having unexplained left wrist pain yesterday.

## 2016-10-27 NOTE — ED Provider Notes (Signed)
[facility]    CSN: [id number] Arrival date & time: 10/27/16  1128     History   Chief Complaint Chief Complaint  Patient presents with  . Wrist Pain    HPI GUILFORD SHANNAHAN Sr. is a 76 y.o. male.   The history is provided by the patient. No language interpreter was used.  Wrist Pain  This is a new problem. The current episode started yesterday. The problem occurs constantly. The problem has not changed since onset.Pertinent negatives include no chest pain, no abdominal pain, no headaches and no shortness of breath. The symptoms are aggravated by bending. The symptoms are relieved by rest. He has tried rest for the symptoms. The treatment provided mild relief.    Past Medical History:  Diagnosis Date  . Anginal pain ([place])   . Anxiety   . 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 Coronary artery disease   . Diabetes mellitus without complication ([place])   . HOH (hard of hearing)    wears hearing aids  . Hypercholesteremia   . Hypertension   . Peripheral vascular disease ([place])   . Shortness of breath dyspnea    orthopnea uses 2 pillows    There are no active problems to display for this patient.   Past Surgical History:  Procedure Laterality Date  . APPENDECTOMY    . CARDIAC CATHETERIZATION     No Stents  . CATARACT EXTRACTION W/PHACO Left 06/17/2015   Procedure: CATARACT EXTRACTION PHACO AND INTRAOCULAR LENS PLACEMENT (IOC);  Surgeon: Birder Robson, MD;  Location: [facility] ORS;  Service: Ophthalmology;  Laterality: Left;  [place] 47.5 AP% 20.9 CDE 9.92 Fluid Pack lot # [id number]  . CATARACT EXTRACTION W/PHACO Right 07/15/2015   Procedure: CATARACT EXTRACTION PHACO AND INTRAOCULAR LENS PLACEMENT (IOC);  Surgeon: Birder Robson, MD;  Location: [facility] ORS;  Service: Ophthalmology;  Laterality: Right;  [place] 01:06 AP% 19.5 CDE 13.04 fluid pack lot # [id number]  . HERNIA REPAIR    . LEG TENDON SURGERY    . MOUTH SURGERY         Home Medications    Prior to  Admission medications   Medication Sig Start Date End Date Taking? Authorizing Provider  aspirin EC 81 MG tablet Take 81 mg by mouth every evening.   Yes [provider]  atorvastatin (LIPITOR) 80 MG tablet Take 80 mg by mouth daily at 6 PM.   Yes [provider]  clopidogrel (PLAVIX) 75 MG tablet Take 75 mg by mouth daily.   Yes [provider]  donepezil (ARICEPT) 10 MG tablet Take 10 mg by mouth at bedtime.   Yes [provider]  isosorbide mononitrate (IMDUR) 120 MG 24 hr tablet Take 120 mg by mouth every morning.   Yes [provider]  metoprolol tartrate (LOPRESSOR) 25 MG tablet Take 12.5 mg by mouth every morning.   Yes [provider]  atropine 1 % ophthalmic solution Place 1 drop into both eyes 2 (two) times daily. Instructed to start on 07/13/15. 06/10/15   [provider]  nitroGLYCERIN (NITROSTAT) 0.4 MG SL tablet Place 0.4 mg under the tongue every 5 (five) minutes as needed for chest pain.    [provider]    Family History History reviewed. No pertinent family history.  Social History Social History  Substance Use Topics  . Smoking status: Former Research scientist (life sciences)  . Smokeless tobacco: Never Used  . Alcohol use No     Allergies  Patient has no known allergies.   Review of Systems Review of Systems  Respiratory: Negative for shortness of breath.   Cardiovascular: Negative for chest pain.  Gastrointestinal: Negative for abdominal pain.  Musculoskeletal: Positive for joint swelling and myalgias.  Neurological: Negative for headaches.  All other systems reviewed and are negative.    Physical Exam Triage Vital Signs ED Triage Vitals  Enc Vitals Group     BP      Pulse      Resp      Temp      Temp src      SpO2      Weight      Height      Head Circumference      Peak Flow      Pain Score      Pain Loc      Pain Edu?      Excl. in [place]?    No data found.   Updated Vital Signs BP (!)  124/94 (BP Location: Left Arm)   Pulse 62   Temp 98.7 F (37.1 C) (Oral)   Resp 16   Ht 5\' 10"  (1.778 m)   Wt 185 lb (83.9 kg)   SpO2 99%   BMI 26.54 kg/m   Visual Acuity Right Eye Distance:   Left Eye Distance:   Bilateral Distance:    Right Eye Near:   Left Eye Near:    Bilateral Near:     Physical Exam  Constitutional: He is oriented to person, place, and time. He appears well-developed and well-nourished. He is active and cooperative.  HENT:  Head: Normocephalic.  Cardiovascular: Regular rhythm and normal pulses.   Pulses:      Radial pulses are 2+ on the right side, and 2+ on the left side.  Pulmonary/Chest: Effort normal.  Musculoskeletal:       Left wrist: He exhibits decreased range of motion, tenderness, bony tenderness and swelling. He exhibits no effusion, no crepitus, no deformity and no laceration.  Neurological: He is alert and oriented to person, place, and time. GCS eye subscore is 4. GCS verbal subscore is 5. GCS motor subscore is 6.  Skin: Skin is warm.  Psychiatric: He has a normal mood and affect. His speech is normal.  Nursing note and vitals reviewed.    UC Treatments / Results  Labs (all labs ordered are listed, but only abnormal results are displayed) Labs Reviewed - No data to display  EKG  EKG Interpretation None       Radiology Dg Wrist Complete Left  Result Date: 10/27/2016 CLINICAL DATA:  Posterior wrist pain for 2 days.  No known injury. EXAM: LEFT WRIST - COMPLETE 3+ VIEW COMPARISON:  None. FINDINGS: The mineralization and alignment are normal. There is no evidence of acute fracture or dislocation. There are mild degenerative changes throughout the carpus and metacarpal phalangeal joints. No erosive changes or bone destruction demonstrated. Possible mild dorsal soft tissue swelling. IMPRESSION: No acute osseous findings identified. Degenerative changes as described with possible mild dorsal soft tissue swelling. Electronically Signed    By: Richardean Sale M.D.   On: 10/27/2016 12:20    Procedures Procedures (including critical care time)  Medications Ordered in UC Medications - No data to display   Initial Impression / Assessment and Plan / UC Course  I have reviewed the triage vital signs and the nursing notes.  Pertinent labs & imaging results that were available during my care of the patient were  reviewed by me and considered in my medical decision making (see chart for details).     Pt placed in left velcro wrsit splint for comfort. Tolerated well. Your xray was negative for fracuture, wear wrist splint for comfort. Follow up with PCP in 1 week, sooner if worse. May need follow up with Orthopedist if s/s persist. Call Orthopedist of your choice or you may check with your insurance company. Rest,ice,elevate. Return to UC as needed. May use OTC tylenol for pain as label directed. Pt verbalized understanding to this provider.   Final Clinical Impressions(s) / UC Diagnoses   Final diagnoses:  Left wrist pain    New Prescriptions Discharge Medication List as of 10/27/2016 12:36 PM       Controlled Substance Prescriptions    Steele Stracener, Jeanett Schlein, NP 01/11[birth year] 1614

## 2017-06-01 DIAGNOSIS — Z85828 Personal history of other malignant neoplasm of skin: Secondary | ICD-10-CM | POA: Diagnosis not present

## 2017-06-01 DIAGNOSIS — L821 Other seborrheic keratosis: Secondary | ICD-10-CM | POA: Diagnosis not present

## 2017-06-01 DIAGNOSIS — L219 Seborrheic dermatitis, unspecified: Secondary | ICD-10-CM | POA: Diagnosis not present

## 2017-06-01 DIAGNOSIS — Z08 Encounter for follow-up examination after completed treatment for malignant neoplasm: Secondary | ICD-10-CM | POA: Diagnosis not present

## 2017-06-01 DIAGNOSIS — D225 Melanocytic nevi of trunk: Secondary | ICD-10-CM | POA: Diagnosis not present

## 2017-06-01 DIAGNOSIS — D485 Neoplasm of uncertain behavior of skin: Secondary | ICD-10-CM | POA: Diagnosis not present

## 2017-09-16 ENCOUNTER — Inpatient Hospital Stay: Payer: Medicare Other

## 2017-09-16 ENCOUNTER — Other Ambulatory Visit: Payer: Self-pay

## 2017-09-16 ENCOUNTER — Emergency Department: Payer: Medicare Other

## 2017-09-16 ENCOUNTER — Inpatient Hospital Stay
Admission: EM | Admit: 2017-09-16 | Discharge: 2017-09-18 | DRG: 871 | Disposition: A | Discharge status: Home Health | Payer: Medicare Other | Attending: Internal Medicine | Admitting: Internal Medicine

## 2017-09-16 DIAGNOSIS — E1151 Type 2 diabetes mellitus with diabetic peripheral angiopathy without gangrene: Secondary | ICD-10-CM | POA: Diagnosis present

## 2017-09-16 DIAGNOSIS — A419 Sepsis, unspecified organism: Principal | ICD-10-CM | POA: Diagnosis present

## 2017-09-16 DIAGNOSIS — J9811 Atelectasis: Secondary | ICD-10-CM | POA: Diagnosis present

## 2017-09-16 DIAGNOSIS — R509 Fever, unspecified: Secondary | ICD-10-CM | POA: Diagnosis not present

## 2017-09-16 DIAGNOSIS — Z9842 Cataract extraction status, left eye: Secondary | ICD-10-CM

## 2017-09-16 DIAGNOSIS — Z79899 Other long term (current) drug therapy: Secondary | ICD-10-CM

## 2017-09-16 DIAGNOSIS — Z87891 Personal history of nicotine dependence: Secondary | ICD-10-CM | POA: Diagnosis not present

## 2017-09-16 DIAGNOSIS — Z85828 Personal history of other malignant neoplasm of skin: Secondary | ICD-10-CM | POA: Diagnosis not present

## 2017-09-16 DIAGNOSIS — F419 Anxiety disorder, unspecified: Secondary | ICD-10-CM | POA: Diagnosis present

## 2017-09-16 DIAGNOSIS — Z7902 Long term (current) use of antithrombotics/antiplatelets: Secondary | ICD-10-CM

## 2017-09-16 DIAGNOSIS — H9190 Unspecified hearing loss, unspecified ear: Secondary | ICD-10-CM | POA: Diagnosis present

## 2017-09-16 DIAGNOSIS — Z7982 Long term (current) use of aspirin: Secondary | ICD-10-CM

## 2017-09-16 DIAGNOSIS — G9341 Metabolic encephalopathy: Secondary | ICD-10-CM | POA: Diagnosis present

## 2017-09-16 DIAGNOSIS — Z961 Presence of intraocular lens: Secondary | ICD-10-CM | POA: Diagnosis present

## 2017-09-16 DIAGNOSIS — Z974 Presence of external hearing-aid: Secondary | ICD-10-CM | POA: Diagnosis not present

## 2017-09-16 DIAGNOSIS — J90 Pleural effusion, not elsewhere classified: Secondary | ICD-10-CM | POA: Diagnosis not present

## 2017-09-16 DIAGNOSIS — R410 Disorientation, unspecified: Secondary | ICD-10-CM | POA: Diagnosis not present

## 2017-09-16 DIAGNOSIS — R413 Other amnesia: Secondary | ICD-10-CM | POA: Diagnosis present

## 2017-09-16 DIAGNOSIS — Z9841 Cataract extraction status, right eye: Secondary | ICD-10-CM

## 2017-09-16 DIAGNOSIS — R4182 Altered mental status, unspecified: Secondary | ICD-10-CM

## 2017-09-16 DIAGNOSIS — E785 Hyperlipidemia, unspecified: Secondary | ICD-10-CM | POA: Diagnosis present

## 2017-09-16 DIAGNOSIS — I340 Nonrheumatic mitral (valve) insufficiency: Secondary | ICD-10-CM | POA: Diagnosis not present

## 2017-09-16 DIAGNOSIS — I2510 Atherosclerotic heart disease of native coronary artery without angina pectoris: Secondary | ICD-10-CM | POA: Diagnosis present

## 2017-09-16 DIAGNOSIS — Z820 Family history of epilepsy and other diseases of the nervous system: Secondary | ICD-10-CM | POA: Diagnosis not present

## 2017-09-16 DIAGNOSIS — G9340 Encephalopathy, unspecified: Secondary | ICD-10-CM | POA: Diagnosis not present

## 2017-09-16 DIAGNOSIS — Z8679 Personal history of other diseases of the circulatory system: Secondary | ICD-10-CM | POA: Diagnosis not present

## 2017-09-16 DIAGNOSIS — I10 Essential (primary) hypertension: Secondary | ICD-10-CM | POA: Diagnosis present

## 2017-09-16 DIAGNOSIS — J189 Pneumonia, unspecified organism: Secondary | ICD-10-CM | POA: Diagnosis present

## 2017-09-16 LAB — CBC WITH DIFFERENTIAL/PLATELET
BASOS ABS: 0 10*3/uL (ref 0–0.1)
BASOS PCT: 0 %
EOS ABS: 0.1 10*3/uL (ref 0–0.7)
Eosinophils Relative: 1 %
HEMATOCRIT: 40.2 % (ref 40.0–52.0)
HEMOGLOBIN: 13.5 g/dL (ref 13.0–18.0)
Lymphocytes Relative: 5 %
Lymphs Abs: 0.7 10*3/uL — ABNORMAL LOW (ref 1.0–3.6)
MCH: 30.2 pg (ref 26.0–34.0)
MCHC: 33.5 g/dL (ref 32.0–36.0)
MCV: 90 fL (ref 80.0–100.0)
MONO ABS: 1.6 10*3/uL — AB (ref 0.2–1.0)
Monocytes Relative: 11 %
NEUTROS ABS: 11.4 10*3/uL — AB (ref 1.4–6.5)
NEUTROS PCT: 83 %
Platelets: 252 10*3/uL (ref 150–440)
RBC: 4.46 MIL/uL (ref 4.40–5.90)
RDW: 13.6 % (ref 11.5–14.5)
WBC: 13.7 10*3/uL — ABNORMAL HIGH (ref 3.8–10.6)

## 2017-09-16 LAB — PROTIME-INR
INR: 1.14
Prothrombin Time: 14.5 seconds (ref 11.4–15.2)

## 2017-09-16 LAB — URINALYSIS, COMPLETE (UACMP) WITH MICROSCOPIC
Bacteria, UA: NONE SEEN
Bilirubin Urine: NEGATIVE
GLUCOSE, UA: NEGATIVE mg/dL
KETONES UR: NEGATIVE mg/dL
Leukocytes, UA: NEGATIVE
Nitrite: NEGATIVE
PH: 5 (ref 5.0–8.0)
Protein, ur: 30 mg/dL — AB
SPECIFIC GRAVITY, URINE: 1.017 (ref 1.005–1.030)
SQUAMOUS EPITHELIAL / LPF: NONE SEEN (ref 0–5)

## 2017-09-16 LAB — COMPREHENSIVE METABOLIC PANEL
ALT: 22 U/L (ref 0–44)
AST: 26 U/L (ref 15–41)
Albumin: 4.1 g/dL (ref 3.5–5.0)
Alkaline Phosphatase: 77 U/L (ref 38–126)
Anion gap: 7 (ref 5–15)
BUN: 23 mg/dL (ref 8–23)
CHLORIDE: 102 mmol/L (ref 98–111)
CO2: 28 mmol/L (ref 22–32)
CREATININE: 1.15 mg/dL (ref 0.61–1.24)
Calcium: 9.1 mg/dL (ref 8.9–10.3)
GFR calc Af Amer: >60 mL/min (ref >60)
GFR, EST NON AFRICAN AMERICAN: 60 mL/min — AB (ref >60)
Glucose, Bld: 125 mg/dL — ABNORMAL HIGH (ref 70–99)
Potassium: 3.8 mmol/L (ref 3.5–5.1)
Sodium: 137 mmol/L (ref 135–145)
TOTAL PROTEIN: 7.6 g/dL (ref 6.5–8.1)
Total Bilirubin: 1.1 mg/dL (ref 0.3–1.2)

## 2017-09-16 LAB — LACTIC ACID, PLASMA
LACTIC ACID, VENOUS: 1.1 mmol/L (ref 0.5–1.9)
LACTIC ACID, VENOUS: 1.5 mmol/L (ref 0.5–1.9)

## 2017-09-16 LAB — PROCALCITONIN: PROCALCITONIN: 0.19 ng/mL

## 2017-09-16 MED ORDER — TRAZODONE HCL 50 MG PO TABS
50.0000 mg | ORAL_TABLET | Freq: Every evening | ORAL | Status: DC | PRN
  Administered 2017-09-16 – 2017-09-17 (×2): 50 mg via ORAL
  Filled 2017-09-16 (×2): qty 1

## 2017-09-16 MED ORDER — NITROGLYCERIN 0.4 MG SL SUBL: 0.4000 mg | SUBLINGUAL_TABLET | SUBLINGUAL | Status: DC | PRN

## 2017-09-16 MED ORDER — ASPIRIN EC 81 MG PO TBEC
81.0000 mg | DELAYED_RELEASE_TABLET | Freq: Every evening | ORAL | Status: DC
  Administered 2017-09-17: 17:00:00 81 mg via ORAL
  Filled 2017-09-16: qty 1

## 2017-09-16 MED ORDER — ATORVASTATIN CALCIUM 20 MG PO TABS
80.0000 mg | ORAL_TABLET | Freq: Every day | ORAL | Status: DC
  Administered 2017-09-17: 80 mg via ORAL
  Filled 2017-09-16: qty 4

## 2017-09-16 MED ORDER — METRONIDAZOLE IN NACL 5-0.79 MG/ML-% IV SOLN
500.0000 mg | Freq: Three times a day (TID) | INTRAVENOUS | Status: DC
  Administered 2017-09-16 – 2017-09-18 (×5): 500 mg via INTRAVENOUS
  Filled 2017-09-16 (×8): qty 100

## 2017-09-16 MED ORDER — ACETAMINOPHEN 325 MG PO TABS
650.0000 mg | ORAL_TABLET | Freq: Four times a day (QID) | ORAL | Status: DC | PRN
  Administered 2017-09-17: 650 mg via ORAL
  Filled 2017-09-16: qty 2

## 2017-09-16 MED ORDER — SODIUM CHLORIDE 0.9 % IV BOLUS
1000.0000 mL | Freq: Once | INTRAVENOUS | Status: AC
  Administered 2017-09-16: 1000 mL via INTRAVENOUS

## 2017-09-16 MED ORDER — ONDANSETRON HCL 4 MG PO TABS: 4.0000 mg | ORAL_TABLET | Freq: Four times a day (QID) | ORAL | Status: DC | PRN

## 2017-09-16 MED ORDER — VANCOMYCIN HCL 10 G IV SOLR
1250.0000 mg | INTRAVENOUS | Status: DC
  Administered 2017-09-17: 04:00:00 1250 mg via INTRAVENOUS
  Filled 2017-09-16 (×2): qty 1250

## 2017-09-16 MED ORDER — VANCOMYCIN HCL IN DEXTROSE 1-5 GM/200ML-% IV SOLN
1000.0000 mg | Freq: Once | INTRAVENOUS | Status: AC
  Administered 2017-09-16: 1000 mg via INTRAVENOUS
  Filled 2017-09-16: qty 200

## 2017-09-16 MED ORDER — SODIUM CHLORIDE 0.9 % IV SOLN
2.0000 g | Freq: Two times a day (BID) | INTRAVENOUS | Status: DC
  Administered 2017-09-17 – 2017-09-18 (×3): 2 g via INTRAVENOUS
  Filled 2017-09-16 (×5): qty 2

## 2017-09-16 MED ORDER — ACETAMINOPHEN 650 MG RE SUPP: 650.0000 mg | Freq: Four times a day (QID) | RECTAL | Status: DC | PRN

## 2017-09-16 MED ORDER — CLOPIDOGREL BISULFATE 75 MG PO TABS
75.0000 mg | ORAL_TABLET | Freq: Every day | ORAL | Status: DC
  Administered 2017-09-17 – 2017-09-18 (×2): 75 mg via ORAL
  Filled 2017-09-16 (×2): qty 1

## 2017-09-16 MED ORDER — ONDANSETRON HCL 4 MG/2ML IJ SOLN: 4.0000 mg | Freq: Four times a day (QID) | INTRAMUSCULAR | Status: DC | PRN

## 2017-09-16 MED ORDER — SODIUM CHLORIDE 0.9 % IV SOLN
2.0000 g | Freq: Once | INTRAVENOUS | Status: AC
  Administered 2017-09-16: 2 g via INTRAVENOUS
  Filled 2017-09-16: qty 2

## 2017-09-16 MED ORDER — ISOSORBIDE MONONITRATE ER 30 MG PO TB24
120.0000 mg | ORAL_TABLET | Freq: Every day | ORAL | Status: DC
  Administered 2017-09-17 – 2017-09-18 (×2): 120 mg via ORAL
  Filled 2017-09-16 (×2): qty 4

## 2017-09-16 MED ORDER — ENOXAPARIN SODIUM 40 MG/0.4ML ~~LOC~~ SOLN
40.0000 mg | SUBCUTANEOUS | Status: DC
  Administered 2017-09-16 – 2017-09-17 (×2): 40 mg via SUBCUTANEOUS
  Filled 2017-09-16 (×2): qty 0.4

## 2017-09-16 MED ORDER — DONEPEZIL HCL 5 MG PO TABS
10.0000 mg | ORAL_TABLET | Freq: Every day | ORAL | Status: DC
  Administered 2017-09-16 – 2017-09-17 (×2): 10 mg via ORAL
  Filled 2017-09-16 (×3): qty 2

## 2017-09-16 MED ORDER — SODIUM CHLORIDE 0.9 % IV SOLN
INTRAVENOUS | Status: DC
  Administered 2017-09-16: 50 mL/h via INTRAVENOUS
  Administered 2017-09-17 – 2017-09-18 (×2): via INTRAVENOUS

## 2017-09-16 NOTE — Consult Note (Signed)
Pharmacy Antibiotic Note  Aaron SEAGO Sr. is a 77 y.o. male admitted on 09/16/2017 with  sepsis secondary to suspected PNA.  Pharmacy has been consulted for Cefepime and Vancomycin dosing. Patient received Cefepime 2g IV and Vancomycin 1g x 1 dose in ED. Patient is also receiving Flagyl 500mg  every 8 hours.  PCT= 0.19.  MRSA PCR pending.   Plan: Ke: 0.051  T1/2: 13.6   VD: 58.73  Start Vancomycin 1250 IV every 18 hours with 6 hour stack dosing.  Goal trough 15-20 mcg/mL. Calculated trough @ Css is 16.6. Trough level prior to 4th dose. Will monitor renal function and adjust dose as needed.  Start Cefepime 2g IV every 12 hours based on current CrC. <85ml/min.   Height: 5\' 7"  (170.2 cm) Weight: 185 lb (83.9 kg) IBW/kg (Calculated) : 66.1  Temp (24hrs), Avg:103.7 F (39.8 C), Min:103.7 F (39.8 C), Max:103.7 F (39.8 C)  Recent Labs  Lab 09/16/17 1826 09/16/17 1830 09/16/17 2051  WBC  --  13.7*  --   CREATININE  --  1.15  --   LATICACIDVEN 1.1  --  1.5    Estimated Creatinine Clearance: 56.6 mL/min (by C-G formula based on SCr of 1.15 mg/dL).    No Known Allergies  Antimicrobials this admission: 8/30 Flagyl  >>  8/30 Cefepime >>  8/30 Vancomycin >>  Microbiology results: 8/30 BCx: pending 8/30 UCx: pending 8/30 MRSA PCR: pending  Thank you for allowing pharmacy to be a part of this patient's care.  Pernell Dupre, PharmD, BCPS Clinical Pharmacist 09/16/2017 10:13 PM

## 2017-09-16 NOTE — ED Triage Notes (Signed)
Pt wife states that pt has not been himself for the last few days. Pt has been more sleepy then normal and when he is awake he disoriented, and unable to follow direction

## 2017-09-16 NOTE — ED Provider Notes (Addendum)
[facility] Emergency Department Provider Note  Time seen: 6:53 PM  I have reviewed the triage vital signs and the nursing notes.   HISTORY  Chief Complaint Altered Mental Status    HPI Aaron ROUTON Sr. is a 77 y.o. male with a past medical history of anxiety, CAD, diabetes, hypertension, hyperlipidemia, presents to the emergency department with fever and confusion.  According to the wife for the past 2 days the patient has been confused, noted to be very warm today is a fever 103.7 in the ED.  States yesterday he was somewhat confused but this morning he seemed better until he took a nap and woke up and was very confused which is what concerned her enough to bring him to the emergency department.  Patient is unable to answer a review of systems due to acute confusion.  Wife denies any recent vomiting, diarrhea unaware of any urinary complaints.  States very mild cough.   Past Medical History:  Diagnosis Date  . Anginal pain ([place])   . Anxiety   . 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 Coronary artery disease   . Diabetes mellitus without complication ([place])   . HOH (hard of hearing)    wears hearing aids  . Hypercholesteremia   . Hypertension   . Peripheral vascular disease ([place])   . Shortness of breath dyspnea    orthopnea uses 2 pillows    There are no active problems to display for this patient.   Past Surgical History:  Procedure Laterality Date  . APPENDECTOMY    . CARDIAC CATHETERIZATION     No Stents  . CATARACT EXTRACTION W/PHACO Left 06/17/2015   Procedure: CATARACT EXTRACTION PHACO AND INTRAOCULAR LENS PLACEMENT (IOC);  Surgeon: Birder Robson, MD;  Location: [facility] ORS;  Service: Ophthalmology;  Laterality: Left;  [place] 47.5 AP% 20.9 CDE 9.92 Fluid Pack lot # [id number]  . CATARACT EXTRACTION W/PHACO Right 07/15/2015   Procedure: CATARACT EXTRACTION PHACO AND INTRAOCULAR LENS PLACEMENT (IOC);  Surgeon: Birder Robson, MD;  Location:  [facility] ORS;  Service: Ophthalmology;  Laterality: Right;  [place] 01:06 AP% 19.5 CDE 13.04 fluid pack lot # [id number]  . HERNIA REPAIR    . LEG TENDON SURGERY    . MOUTH SURGERY      Prior to Admission medications   Medication Sig Start Date End Date Taking? Authorizing Provider  aspirin EC 81 MG tablet Take 81 mg by mouth every evening.    [provider]  atorvastatin (LIPITOR) 80 MG tablet Take 80 mg by mouth daily at 6 PM.    [provider]  atropine 1 % ophthalmic solution Place 1 drop into both eyes 2 (two) times daily. Instructed to start on 07/13/15. 06/10/15   [provider]  clopidogrel (PLAVIX) 75 MG tablet Take 75 mg by mouth daily.    [provider]  donepezil (ARICEPT) 10 MG tablet Take 10 mg by mouth at bedtime.    [provider]  isosorbide mononitrate (IMDUR) 120 MG 24 hr tablet Take 120 mg by mouth every morning.    [provider]  metoprolol tartrate (LOPRESSOR) 25 MG tablet Take 12.5 mg by mouth every morning.    [provider]  nitroGLYCERIN (NITROSTAT) 0.4 MG SL tablet Place 0.4 mg under the tongue every 5 (five) minutes as needed for chest pain.    [provider]    No Known Allergies  No family history on file.  Social  History Social History   Tobacco Use  . Smoking status: Former Research scientist (life sciences)  . Smokeless tobacco: Never Used  Substance Use Topics  . Alcohol use: No  . Drug use: No    Review of Systems Unable to obtain an adequate/accurate review of systems secondary to altered mental status/confusion.  ____________________________________________   PHYSICAL EXAM:  VITAL SIGNS: ED Triage Vitals  Enc Vitals Group     BP 09/16/17 1823 128/64     Pulse Rate 09/16/17 1823 89     Resp 09/16/17 1826 (!) 22     Temp 09/16/17 1823 (!) 103.7 F (39.8 C)     Temp Source 09/16/17 1823 Oral     SpO2 09/16/17 1823 95 %     Weight 09/16/17 1824 185 lb (83.9 kg)     Height 09/16/17 1824 5'  7" (1.702 m)     Head Circumference --      Peak Flow --      Pain Score 09/16/17 1824 0     Pain Loc --      Pain Edu? --      Excl. in [place]? --    Constitutional: Alert and oriented. Well appearing and in no distress. Eyes: Normal exam ENT   Head: Normocephalic and atraumatic   Mouth/Throat: Mucous membranes are moist. Cardiovascular: Normal rate, regular rhythm. No murmur Respiratory: Normal respiratory effort without tachypnea nor retractions. Breath sounds are clear Gastrointestinal: Soft and nontender. No distention.  Musculoskeletal: Nontender with normal range of motion in all extremities.  Neurologic:  Normal speech and language. No gross focal neurologic deficits  Skin:  Skin is warm, dry and intact.  Psychiatric: Mood and affect are normal.  ____________________________________________    RADIOLOGY  Small pleural effusion.  ____________________________________________   INITIAL IMPRESSION / ASSESSMENT AND PLAN / ED COURSE  Pertinent labs & imaging results that were available during my care of the patient were reviewed by me and considered in my medical decision making (see chart for details).  Patient presents emergency department acute onset of confusion since last night found to be febrile to 103.7 tachycardic to 94 with a respiratory rate of 22 meeting sepsis criteria.  We will check labs, cultures, start broad-spectrum antibiotics.  Differential would include infectious etiology such as pneumonia, urinary tract infection, viral infection, URI.  Reassuringly patient has clear lung sounds bilaterally, has a nontender benign abdominal exam.  Small pleural effusion on chest x-ray with atelectasis.  This could possibly represent early pneumonia given the patient's fever and cough.  Patient's white blood cell count is elevated at 13.7.  Broad-spectrum antibiotics have been ordered.  Urinalysis does show white blood cells and red blood cells but no bacteria.  Urine  culture has been sent.  Blood cultures have been sent.  Patient will be admitted to the hospital service for continued work-up.   EKG reviewed and interpreted by myself shows normal sinus rhythm 86 bpm with a narrow QRS, normal axis, largely normal intervals but no concerning ST changes.  CRITICAL CARE Performed by: Harvest Dark   Total critical care time: 30 minutes  Critical care time was exclusive of separately billable procedures and treating other patients.  Critical care was necessary to treat or prevent imminent or life-threatening deterioration.  Critical care was time spent personally by me on the following activities: development of treatment plan with patient and/or surrogate as well as nursing, discussions with consultants, evaluation of patient's response to treatment, examination of patient, obtaining history from patient or  surrogate, ordering and performing treatments and interventions, ordering and review of laboratory studies, ordering and review of radiographic studies, pulse oximetry and re-evaluation of patient's condition.   ____________________________________________   FINAL CLINICAL IMPRESSION(S) / ED DIAGNOSES  Sepsis Confusion Fever    Harvest Dark, MD 09/16/17 7615    Harvest Dark, MD 09/16/17 [phone]

## 2017-09-16 NOTE — ED Notes (Signed)
Called 1C to give report, nurse states she is in middle of a procedure and needs me to call back in 20 minutes.

## 2017-09-16 NOTE — Progress Notes (Signed)
Patient ID: Aaron Channel Sr., male   DOB: 07-29-1940, 77 y.o.   MRN: [id number]  ACP note  Patient and wife at the bedside  Diagnosis: Clinical sepsis, acute encephalopathy, history of CAD, memory loss, hyperlipidemia  CODE STATUS discussed.  Patient wishes to be a full code.  Plan.  Aggressive antibiotics with vancomycin and cefepime.  Follow-up blood cultures.  Since the patient did have dental work done need to rule out endocarditis.  Obtain an echocardiogram tomorrow.  MRI of the brain to rule out stroke.  Time spent on ACP discussion 17 minutes Dr. Loletha Grayer

## 2017-09-16 NOTE — Progress Notes (Signed)
CODE SEPSIS - PHARMACY COMMUNICATION  **Broad Spectrum Antibiotics should be administered within 1 hour of Sepsis diagnosis**  Time Code Sepsis Called/Page Received: 19:30  Antibiotics Ordered: cefepime/metronidazole/vancomycin  Time of 1st antibiotic administration: 19:45  Additional action taken by pharmacy:   If necessary, Name of Provider/Nurse Contacted:     Laural Benes, Pharm.D., BCPS Clinical Pharmacist  09/16/2017  7:36 PM

## 2017-09-16 NOTE — H&P (Addendum)
[facility] NAME: Aaron Garcia    MR#:  [id number]  DATE OF BIRTH:  1940/01/27  DATE OF ADMISSION:  09/16/2017  PRIMARY CARE PHYSICIAN: [facility]   REQUESTING/REFERRING PHYSICIAN: Dr. Kerman Passey  CHIEF COMPLAINT:   Chief Complaint  Patient presents with  . Altered Mental Status    HISTORY OF PRESENT ILLNESS:  Aaron Garcia  is a 77 y.o. male with a known history of memory loss.  For the last 2 days he has been a little more sleepy groggy and confused and incoherent.  Today he went for a teeth cleaning today.  He has been getting weaker and weaker.  He has been having crazy dreams and lashing out.  He has been having trouble making a sentence.  In the ER, he was found to have a temperature of 103.  Not much other complaints.  Slight cough at night.  PAST MEDICAL HISTORY:   Past Medical History:  Diagnosis Date  . Anginal pain ([place])   . Anxiety   . 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 Coronary artery disease   . Diabetes mellitus without complication ([place])   . HOH (hard of hearing)    wears hearing aids  . Hypercholesteremia   . Hypertension   . Peripheral vascular disease ([place])   . Shortness of breath dyspnea    orthopnea uses 2 pillows    PAST SURGICAL HISTORY:   Past Surgical History:  Procedure Laterality Date  . APPENDECTOMY    . CARDIAC CATHETERIZATION     No Stents  . CATARACT EXTRACTION W/PHACO Left 06/17/2015   Procedure: CATARACT EXTRACTION PHACO AND INTRAOCULAR LENS PLACEMENT (IOC);  Surgeon: Birder Robson, MD;  Location: [facility] ORS;  Service: Ophthalmology;  Laterality: Left;  [place] 47.5 AP% 20.9 CDE 9.92 Fluid Pack lot # [id number]  . CATARACT EXTRACTION W/PHACO Right 07/15/2015   Procedure: CATARACT EXTRACTION PHACO AND INTRAOCULAR LENS PLACEMENT (IOC);  Surgeon: Birder Robson, MD;  Location: [facility] ORS;  Service: Ophthalmology;  Laterality: Right;  [place] 01:06 AP% 19.5 CDE  13.04 fluid pack lot # [id number]  . HERNIA REPAIR    . LEG TENDON SURGERY    . MOUTH SURGERY      SOCIAL HISTORY:   Social History   Tobacco Use  . Smoking status: Former Research scientist (life sciences)  . Smokeless tobacco: Never Used  Substance Use Topics  . Alcohol use: No    FAMILY HISTORY:   Family History  Problem Relation Age of Onset  . Hodgkin's lymphoma Father   . Memory loss Sister   . Alzheimer's disease Brother     DRUG ALLERGIES:  No Known Allergies  REVIEW OF SYSTEMS:  CONSTITUTIONAL: Positive for chills, sweats and fever.  Positive for fatigue and weakness.  EYES: No blurred or double vision.  EARS, NOSE, AND THROAT: No tinnitus or ear pain. No sore throat.  Decreased hearing RESPIRATORY: Positive cough at night.  No shortness of breath, wheezing or hemoptysis.  CARDIOVASCULAR: No chest pain, orthopnea, edema.  GASTROINTESTINAL: No nausea, vomiting, diarrhea or abdominal pain. No blood in bowel movements GENITOURINARY: No dysuria, hematuria.  ENDOCRINE: No polyuria, nocturia,  HEMATOLOGY: No anemia, easy bruising or bleeding SKIN: No rash or lesion. MUSCULOSKELETAL: No joint pain or arthritis.  Positive for leg cramps at night NEUROLOGIC: No tingling, numbness, weakness.  PSYCHIATRY: No anxiety or depression.   MEDICATIONS AT HOME:   Prior to Admission medications   Medication Sig  Start Date End Date Taking? Authorizing Provider  aspirin EC 81 MG tablet Take 81 mg by mouth every evening.   Yes [provider]  atorvastatin (LIPITOR) 80 MG tablet Take 80 mg by mouth daily at 6 PM.   Yes [provider]  nitroGLYCERIN (NITROSTAT) 0.4 MG SL tablet Place 0.4 mg under the tongue every 5 (five) minutes as needed for chest pain.    [provider]      VITAL SIGNS:  Blood pressure 131/72, pulse 82, temperature (!) 103.7 F (39.8 C), temperature source Oral, resp. rate 14, height 5\' 7"  (1.702 m), weight 83.9 kg, SpO2 100 %.  PHYSICAL EXAMINATION:   GENERAL:  77 y.o.-year-old patient lying in the bed with no acute distress.  EYES: Pupils equal, round, reactive to light and accommodation. No scleral icterus. Extraocular muscles intact.  HEENT: Head atraumatic, normocephalic. Oropharynx and nasopharynx clear.  NECK:  Supple, no jugular venous distention. No thyroid enlargement, no tenderness.  LUNGS: Normal breath sounds bilaterally, no wheezing, rales,rhonchi or crepitation. No use of accessory muscles of respiration.  CARDIOVASCULAR: S1, S2 normal. No murmurs, rubs, or gallops.  ABDOMEN: Soft, nontender, nondistended. Bowel sounds present. No organomegaly or mass.  EXTREMITIES: Trace pedal edema, no cyanosis, or clubbing.  NEUROLOGIC: Patient with difficulty getting his words out how he wants.  Muscle strength 5/5 in all extremities. Sensation intact. Gait not checked.  PSYCHIATRIC: The patient is alert and answers questions.  SKIN: No rash, lesion, or ulcer.   LABORATORY PANEL:   CBC Recent Labs  Lab 09/16/17 1830  WBC 13.7*  HGB 13.5  HCT 40.2  PLT 252   ------------------------------------------------------------------------------------------------------------------  Chemistries  Recent Labs  Lab 09/16/17 1830  NA 137  K 3.8  CL 102  CO2 28  GLUCOSE 125*  BUN 23  CREATININE 1.15  CALCIUM 9.1  AST 26  ALT 22  ALKPHOS 77  BILITOT 1.1   ------------------------------------------------------------------------------------------------------------------    RADIOLOGY:  Dg Chest 2 View  Result Date: 09/16/2017 CLINICAL DATA:  Altered mental status EXAM: CHEST - 2 VIEW COMPARISON:  None. FINDINGS: Blunting of the right hemidiaphragm with suspected small right pleural effusion. No focal airspace consolidation or pulmonary edema. Linear opacities in the left mid lung. No pneumothorax. IMPRESSION: Small right pleural effusion with associated atelectasis. Electronically Signed   By: Ulyses Jarred M.D.   On: 09/16/2017  19:27    EKG:   Normal sinus rhythm 86 bpm no acute ST-T wave changes  IMPRESSION AND PLAN:   1.  Clinical sepsis with fever, leukocytosis and suspected pneumonia with slight cough.  Repeat chest x-ray tomorrow morning.  Gentle IV fluids.  Follow-up cultures.  Empiric antibiotics with vancomycin and cefepime.  Since the patient had dental work today need to rule out endocarditis.  Obtain an echocardiogram also. 2.  Acute encephalopathy likely secondary to sepsis and fever.  CT scan of the head to rule out stroke.  MRI of the brain.  Patient on aspirin and Plavix already 3.  History of CAD on Imdur Plavix and aspirin.  Family states that mental status worsened once starting metoprolol so I will hold this for right now. 4.  Memory loss on benazepril 5.  Hyperlipidemia unspecified on Lipitor.  Check a lipid profile tomorrow morning  All the records are reviewed and case discussed with ED provider. Management plans discussed with the patient, family and they are in agreement.  CODE STATUS: Full code  TOTAL TIME TAKING CARE OF THIS PATIENT: 54  minutes, including acp time.    Loletha Grayer M.D on 09/16/2017 at 8:32 PM  Between 7am to 6pm - Pager - [phone]  After 6pm call admission pager [phone]  [facility]  [phone]  CC: Primary care physician; [place]

## 2017-09-17 ENCOUNTER — Inpatient Hospital Stay: Payer: Medicare Other

## 2017-09-17 ENCOUNTER — Inpatient Hospital Stay (HOSPITAL_COMMUNITY)
Admit: 2017-09-17 | Discharge: 2017-09-17 | Disposition: A | Discharge status: Home / Self Care | Payer: Medicare Other | Attending: Internal Medicine | Admitting: Internal Medicine

## 2017-09-17 DIAGNOSIS — I340 Nonrheumatic mitral (valve) insufficiency: Secondary | ICD-10-CM

## 2017-09-17 LAB — BASIC METABOLIC PANEL
Anion gap: 7 (ref 5–15)
BUN: 20 mg/dL (ref 8–23)
CO2: 29 mmol/L (ref 22–32)
Calcium: 8.4 mg/dL — ABNORMAL LOW (ref 8.9–10.3)
Chloride: 106 mmol/L (ref 98–111)
Creatinine, Ser: 1.04 mg/dL (ref 0.61–1.24)
GFR calc Af Amer: >60 mL/min (ref >60)
GLUCOSE: 170 mg/dL — AB (ref 70–99)
POTASSIUM: 3.6 mmol/L (ref 3.5–5.1)
Sodium: 142 mmol/L (ref 135–145)

## 2017-09-17 LAB — MRSA PCR SCREENING: MRSA by PCR: NEGATIVE

## 2017-09-17 LAB — CBC
HEMATOCRIT: 35.1 % — AB (ref 40.0–52.0)
Hemoglobin: 11.8 g/dL — ABNORMAL LOW (ref 13.0–18.0)
MCH: 30.6 pg (ref 26.0–34.0)
MCHC: 33.7 g/dL (ref 32.0–36.0)
MCV: 90.9 fL (ref 80.0–100.0)
Platelets: 202 10*3/uL (ref 150–440)
RBC: 3.86 MIL/uL — ABNORMAL LOW (ref 4.40–5.90)
RDW: 13.6 % (ref 11.5–14.5)
WBC: 11.7 10*3/uL — ABNORMAL HIGH (ref 3.8–10.6)

## 2017-09-17 LAB — ECHOCARDIOGRAM COMPLETE
Height: 70 in
Weight: 2899.49 oz

## 2017-09-17 LAB — PROCALCITONIN: PROCALCITONIN: 0.19 ng/mL

## 2017-09-17 MED ORDER — PERFLUTREN LIPID MICROSPHERE
1.0000 mL | INTRAVENOUS | Status: AC | PRN
  Administered 2017-09-17: 2 mL via INTRAVENOUS
  Filled 2017-09-17: qty 10

## 2017-09-17 NOTE — Evaluation (Signed)
Physical Therapy Evaluation Patient Details Name: Aaron GOLPHIN Sr. MRN: [id number] DOB: 06-17-1940 Today's Date: 09/17/2017   History of Present Illness  Aaron Garcia is a 77yo male who [facility] on 8[facility]30 c increased lethargy and AMS: pt admitted with sepsis d/t PNA.   Clinical Impression  Pt admitted with above diagnosis. Pt currently with functional limitations due to the deficits listed below (see "PT Problem List"). Upon entry, pt in bed, wife and daughter present. The pt is awake and agreeable to participate. The pt is alert and oriented to self and situation, pleasant, conversational, and following simple commands consistently. Pt transfers and AMB at minGuard assist, slowly, but near baseline, however 2-3 losses of stability, which he endorses as not typical. Functional mobility assessment demonstrates increased effort/time requirements and need for supervision, whereas the patient performed these at a higher level of independence PTA. Pt will benefit from skilled PT intervention to increase independence and safety with basic mobility in preparation for discharge to the venue listed below.       Follow Up Recommendations Home health PT    Equipment Recommendations  None recommended by PT    Recommendations for Other Services       Precautions / Restrictions Precautions Precautions: Fall      Mobility  Bed Mobility               General bed mobility comments: received up in chair upon entry  Transfers Overall transfer level: Needs assistance Equipment used: None Transfers: Sit to/from Stand Sit to Stand: Supervision         General transfer comment: additional time and effort needed, appears to be somewhat related to slowed processing.   Ambulation/Gait Ambulation/Gait assistance: Min guard Gait Distance (Feet): 200 Feet Assistive device: None Gait Pattern/deviations: WFL(Within Functional Limits)     General Gait Details: mild angina p 175ft, non  concerning to patient; 2-3 instances of instability, able to self correct. Pt endorses >instability as compared to baseline.   Stairs            Wheelchair Mobility    Modified Rankin (Stroke Patients Only)       Balance Overall balance assessment: Mild deficits observed, not formally tested;Needs assistance                                           Pertinent Vitals/Pain Pain Assessment: No/denies pain    Home Living Family/patient expects to be discharged to:: Private residence Living Arrangements: Spouse/significant other Available Help at Discharge: Family Type of Home: House Home Access: Stairs to enter Entrance Stairs-Rails: Right Entrance Stairs-Number of Steps: 4 Home Layout: Two level;Able to live on main level with bedroom/bathroom(pt does not access 2nd floor at bas[place]ne. ) Home Equipment: Gilford Rile - 2 wheels      Prior Function Level of Independence: Needs assistance   Gait / Transfers Assistance Needed: independent with most mobility, but AMB is limited by angina, which is worse postprandial, takes nitro 'seldomly' per wife   ADL's / Homemaking Assistance Needed: modI   Comments: baseline cognitive deficits and memory impairment      Hand Dominance        Extremity/Trunk Assessment                Communication   Communication: HOH  Cognition Arousal/Alertness: Awake/alert Behavior During Therapy: WFL for tasks assessed/performed Overall Cognitive Status:  History of cognitive impairments - at baseline(very near baseline at time of eval per wife)                                        General Comments      Exercises     Assessment/Plan    PT Assessment Patient needs continued PT services  PT Problem List Decreased activity tolerance;Decreased balance       PT Treatment Interventions DME instruction;Gait training;Functional mobility training;Therapeutic exercise    PT Goals (Current goals can be  found in the Care Plan section)  Acute Rehab PT Goals Patient Stated Goal: regain strength and gait stability.  PT Goal Formulation: With patient/family Time For Goal Achievement: 10/01/17 Potential to Achieve Goals: Good    Frequency Min 2X/week   Barriers to discharge        Co-evaluation               AM-PAC PT "6 Clicks" Daily Activity  Outcome Measure Difficulty turning over in bed (including adjusting bedclothes, sheets and blankets)?: A Little Difficulty moving from lying on back to sitting on the side of the bed? : A Little Difficulty sitting down on and standing up from a chair with arms (e.g., wheelchair, bedside commode, etc,.)?: Unable Help needed moving to and from a bed to chair (including a wheelchair)?: A Little Help needed walking in hospital room?: A Little Help needed climbing 3-5 steps with a railing? : A Lot 6 Click Score: 15    End of Session   Activity Tolerance: Patient tolerated treatment well;Other (comment)(mild angina with AMB. Pt does not AMB much farther than this at baseline. ) Patient left: in chair;with family/visitor present;with call bell/phone within reach;with chair alarm set Nurse Communication: Mobility status PT Visit Diagnosis: Unsteadiness on feet (R26.81);Difficulty in walking, not elsewhere classified (R26.2);Other symptoms and signs involving the nervous system (R29.898)    Time: 3235-5732 PT Time Calculation (min) (ACUTE ONLY): 22 min   Charges:   PT Evaluation $PT Eval Moderate Complexity: 1 Mod PT Treatments $Therapeutic Activity: 8-22 mins        1:14 PM, 09/17/17 Etta Grandchild, PT, DPT Physical Therapist - [facility]  [phone] ([place])   Kaydan Wong C 09/17/2017, 1:09 PM

## 2017-09-17 NOTE — Progress Notes (Signed)
[facility] [place] NAME: Aaron Garcia    MR#:  [id number]  DATE OF BIRTH:  1940/02/24  SUBJECTIVE:  CHIEF COMPLAINT: Patient is feeling okay today.  Has history of memory loss  REVIEW OF SYSTEMS:  CONSTITUTIONAL: No fever, fatigue or weakness.  EYES: No blurred or double vision.  EARS, NOSE, AND THROAT: No tinnitus or ear pain.  RESPIRATORY: Reporting some cough, denies shortness of breath, wheezing or hemoptysis.  CARDIOVASCULAR: No chest pain, orthopnea, edema.  GASTROINTESTINAL: No nausea, vomiting, diarrhea or abdominal pain.  GENITOURINARY: No dysuria, hematuria.  ENDOCRINE: No polyuria, nocturia,  HEMATOLOGY: No anemia, easy bruising or bleeding SKIN: No rash or lesion. MUSCULOSKELETAL: No joint pain or arthritis.   NEUROLOGIC: No tingling, numbness, weakness.  PSYCHIATRY: No anxiety or depression.   DRUG ALLERGIES:  No Known Allergies  VITALS:  Blood pressure (!) 123/57, pulse 73, temperature 98.9 F (37.2 C), temperature source Oral, resp. rate 18, height 5\' 10"  (1.778 m), weight 82.2 kg, SpO2 95 %.  PHYSICAL EXAMINATION:  GENERAL:  77 y.o.-year-old patient lying in the bed with no acute distress.  EYES: Pupils equal, round, reactive to light and accommodation. No scleral icterus. Extraocular muscles intact.  HEENT: Head atraumatic, normocephalic. Oropharynx and nasopharynx clear.  NECK:  Supple, no jugular venous distention. No thyroid enlargement, no tenderness.  LUNGS: Normal breath sounds bilaterally, no wheezing, rales,rhonchi or crepitation. No use of accessory muscles of respiration.  CARDIOVASCULAR: S1, S2 normal. No murmurs, rubs, or gallops.  ABDOMEN: Soft, nontender, nondistended. Bowel sounds present. No organomegaly or mass.  EXTREMITIES: No pedal edema, cyanosis, or clubbing.  NEUROLOGIC: Cranial nerves II through XII are intact. Muscle strength 5/5 in all extremities. Sensation intact. Gait not  checked.  PSYCHIATRIC: The patient is alert and oriented x 3.  SKIN: No obvious rash, lesion, or ulcer.    LABORATORY PANEL:   CBC Recent Labs  Lab 09/17/17 0319  WBC 11.7*  HGB 11.8*  HCT 35.1*  PLT 202   ------------------------------------------------------------------------------------------------------------------  Chemistries  Recent Labs  Lab 09/16/17 1830 09/17/17 0319  NA 137 142  K 3.8 3.6  CL 102 106  CO2 28 29  GLUCOSE 125* 170*  BUN 23 20  CREATININE 1.15 1.04  CALCIUM 9.1 8.4*  AST 26  --   ALT 22  --   ALKPHOS 77  --   BILITOT 1.1  --    ------------------------------------------------------------------------------------------------------------------  Cardiac Enzymes No results for input(s): TROPONINI in the last 168 hours. ------------------------------------------------------------------------------------------------------------------  RADIOLOGY:  Dg Chest 2 View  Result Date: 09/17/2017 CLINICAL DATA:  Sepsis EXAM: CHEST - 2 VIEW COMPARISON:  History day FINDINGS: Lower volumes with patchy opacity at the bases. Blunting of the lateral right costophrenic sulcus. No pneumothorax. Stable normal heart size for technique. IMPRESSION: Lower volumes with atelectasis or pneumonia at the bases. Electronically Signed   By: Monte Fantasia M.D.   On: 09/17/2017 09:03   Dg Chest 2 View  Result Date: 09/16/2017 CLINICAL DATA:  Altered mental status EXAM: CHEST - 2 VIEW COMPARISON:  None. FINDINGS: Blunting of the right hemidiaphragm with suspected small right pleural effusion. No focal airspace consolidation or pulmonary edema. Linear opacities in the left mid lung. No pneumothorax. IMPRESSION: Small right pleural effusion with associated atelectasis. Electronically Signed   By: Ulyses Jarred M.D.   On: 09/16/2017 19:27   Ct Head Wo Contrast  Result Date: 09/16/2017 CLINICAL DATA:  Altered mental status of unclear cause.  Patient has been more sleepy than  normal and confused when awakened. EXAM: CT HEAD WITHOUT CONTRAST TECHNIQUE: Contiguous axial images were obtained from the base of the skull through the vertex without intravenous contrast. COMPARISON:  None. FINDINGS: BRAIN: There is sulcal and ventricular prominence consistent with superficial and central atrophy. No intraparenchymal hemorrhage, mass effect nor midline shift. Periventricular and subcortical white matter hypodensities consistent with chronic small vessel ischemic disease are identified. No acute large vascular territory infarcts. No abnormal extra-axial fluid collections. Basal cisterns are not effaced and midline. Idiopathic bilateral basal ganglial calcifications are seen. VASCULAR: Moderate calcific atherosclerosis of the carotid siphons and distal vertebral arteries. SKULL: No skull fracture. No significant scalp soft tissue swelling. SINUSES/ORBITS: The mastoid air-cells are clear. The included paranasal sinuses are well-aerated.The included ocular globes and orbital contents are non-suspicious. OTHER: None. IMPRESSION: Atrophy with chronic appearing small vessel ischemia. No acute intracranial abnormality. Electronically Signed   By: Ashley Royalty M.D.   On: 09/16/2017 21:06   Mr Brain Wo Contrast  Result Date: 09/17/2017 CLINICAL DATA:  Altered mental status for 2 days, progressive weakness. Febrile. History of memory loss, hypertension, hypercholesterolemia, diabetes. EXAM: MRI HEAD WITHOUT CONTRAST TECHNIQUE: Multiplanar, multiecho pulse sequences of the brain and surrounding structures were obtained without intravenous contrast. COMPARISON:  CT HEAD September 16, 2017 FINDINGS: INTRACRANIAL CONTENTS: No reduced diffusion to suggest acute ischemia. No susceptibility artifact to suggest hemorrhage. The ventricles and sulci are normal for patient's age. Patchy supratentorial white matter FLAIR T2 hyperintensities compatible with mild chronic small vessel ischemic changes, normal for age. No  suspicious parenchymal signal, masses, mass effect. No abnormal extra-axial fluid collections. No extra-axial masses. VASCULAR: Normal major intracranial vascular flow voids present at skull base. SKULL AND UPPER CERVICAL SPINE: No abnormal sellar expansion. No suspicious calvarial bone marrow signal. Craniocervical junction maintained. SINUSES/ORBITS: The mastoid air-cells and included paranasal sinuses are well-aerated.The included ocular globes and orbital contents are non-suspicious. Status post bilateral ocular lens implants. OTHER: None. IMPRESSION: 1. Normal noncontrast MRI head for age. Electronically Signed   By: Elon Alas M.D.   On: 09/17/2017 01:06    EKG:   Orders placed or performed during the hospital encounter of 09/16/17  . ED EKG 12-Lead  . ED EKG 12-Lead  . EKG 12-Lead  . EKG 12-Lead    ASSESSMENT AND PLAN:    1.  Clinical sepsis with fever, leukocytosis and suspected pneumonia with slight cough.   Repeat chest x-ray with a pneumonia versus atelectasis Incentive spirometry Continue gentle IV fluids Empiric antibiotics with vancomycin and cefepime.  MRSA PCR negative discontinue vancomycin  Since the patient had dental work on September 16, 2017 need to rule out endocarditis.  Obtain an echocardiogram also.  2.  Acute encephalopathy likely secondary to sepsis and fever.  CT scan of the head is negative.  MRI of the brain is negative.  Patient is more awake and alert today Continue home medication aspirin and Plavix   3.  History of CAD on Imdur Plavix and aspirin Metoprolol on hold secondary to soft blood pressure  4.  Memory loss on donepezil  5.  Hyperlipidemia unspecified on Lipitor.  Check a lipid profile tomorrow morning   All the records are reviewed and case discussed with Care Management/Social Workerr. Management plans discussed with the patient, family and they are in agreement.  CODE STATUS: fc   TOTAL TIME TAKING CARE OF THIS PATIENT: 37   minutes.   POSSIBLE D/C IN 2  DAYS,  DEPENDING ON CLINICAL CONDITION.  Note: This dictation was prepared with Dragon dictation along with smaller phrase technology. Any transcriptional errors that result from this process are unintentional.   Nicholes Mango M.D on 09/17/2017 at 1:07 PM  Between 7am to 6pm - Pager - [phone] After 6pm go to [email or web address] - password [email or web address] [facility]  Office  [phone]  CC: Primary care physician; [place]

## 2017-09-17 NOTE — Plan of Care (Signed)

## 2017-09-17 NOTE — Plan of Care (Signed)
  Problem: Clinical Measurements: Goal: Ability to maintain clinical measurements within normal limits will improve Outcome: Progressing Pt A&O x 1 this shift Goal: Will remain free from infection Outcome: Progressing Goal: Diagnostic test results will improve Outcome: Progressing 09/17/17 CXR performed as ordered in [facility] Goal: Respiratory complications will improve Outcome: Progressing  Incentive Spirometry at bedside by Resp There; verbally encouraged pt to use once per hour while awake Problem: Activity: Goal: Risk for activity intolerance will decrease Outcome: Progressing  Physical Therapy evaluation performed this shift; pt ambulated in hallway; oob to chair by staff this shift; pt tolerated well with no c/o Problem: Nutrition: Goal: Adequate nutrition will be maintained Outcome: Progressing  Pt eating heart healthy diet; ate food from home too Problem: Pain Managment: Goal: General experience of comfort will improve Outcome: Progressing   Problem: Safety: Goal: Ability to remain free from injury will improve Outcome: Progressing  Pt remains a High Fall Risk; family at bedside the majority of the shift Problem: Skin Integrity: Goal: Risk for impaired skin integrity will decrease Outcome: Progressing  External catheter (condom cath) in use d/t pt's disorientation and safety concerns

## 2017-09-18 LAB — URINE CULTURE

## 2017-09-18 LAB — CBC
HEMATOCRIT: 35.5 % — AB (ref 40.0–52.0)
HEMOGLOBIN: 11.9 g/dL — AB (ref 13.0–18.0)
MCH: 30.5 pg (ref 26.0–34.0)
MCHC: 33.5 g/dL (ref 32.0–36.0)
MCV: 90.9 fL (ref 80.0–100.0)
Platelets: 199 10*3/uL (ref 150–440)
RBC: 3.91 MIL/uL — AB (ref 4.40–5.90)
RDW: 13.3 % (ref 11.5–14.5)
WBC: 9.2 10*3/uL (ref 3.8–10.6)

## 2017-09-18 LAB — PROCALCITONIN: PROCALCITONIN: 0.16 ng/mL

## 2017-09-18 MED ORDER — SACCHAROMYCES BOULARDII 250 MG PO CAPS: 250.0000 mg | ORAL_CAPSULE | Freq: Two times a day (BID) | ORAL | 0 refills | Status: AC

## 2017-09-18 MED ORDER — ACETAMINOPHEN 325 MG PO TABS: 650.0000 mg | ORAL_TABLET | Freq: Four times a day (QID) | ORAL | Status: AC | PRN

## 2017-09-18 MED ORDER — CEFDINIR 300 MG PO CAPS: 300.0000 mg | ORAL_CAPSULE | Freq: Two times a day (BID) | ORAL | 0 refills | Status: DC

## 2017-09-18 MED ORDER — CEFDINIR 300 MG PO CAPS
300.0000 mg | ORAL_CAPSULE | Freq: Two times a day (BID) | ORAL | Status: DC
  Filled 2017-09-18: qty 1

## 2017-09-18 NOTE — Care Management Note (Signed)
Case Management Note  Patient Details  Name: Aaron TERRIEN Sr. MRN: [id number] Date of Birth: 1940-02-18  Subjective/Objective:  Patient to be discharged per MD order. Orders in place for home health services. RNCM spoke with patient and spouse regarding services. They are unfamiliar with agencies in the area but when provided a list [facility] sounded familiar. Referral placed with [place] who accepts the case for PT and social work services. There are no DME needs. Spouse to provide transportation at discharge.  Ines Bloomer RN BSN RNCM [phone]                     Action/Plan:   Expected Discharge Date:  09/18/17               Expected Discharge Plan:  [place]  In-House Referral:     Discharge planning Services  CM Consult  Post Acute Care Choice:  Home Health Choice offered to:  Patient, Spouse  DME Arranged:    DME Agency:     HH Arranged:  PT, Social Work [place] Agency:  [facility]  Status of Service:  Completed, signed off  If discussed at [employer], dates discussed:    Additional Comments:  Kathyrn Drown Aashrith Eves, RN 09/18/2017, 10:22 AM

## 2017-09-18 NOTE — Progress Notes (Signed)
MD order received to discharge pt home with home health today; Care Management previously established home health PT and SW services with [facility]; verbally reviewed AVS with pt and pt's spouse, Joshue Badal; Rxs given to pt; no questions voiced at this time; pt discharged via wheelchair by nursing to the visitor's entrance

## 2017-09-18 NOTE — Discharge Summary (Signed)
. [facility] [place] NAME: Aaron Garcia    MR#:  [id number]  DATE OF BIRTH:  03/20/1940  DATE OF ADMISSION:  09/16/2017 ADMITTING PHYSICIAN: Loletha Grayer, MD  DATE OF DISCHARGE: 09/18/17 PRIMARY CARE PHYSICIAN: [facility]    ADMISSION DIAGNOSIS:  Sepsis ([place]) [A41.9] Sepsis, due to unspecified organism ([place]) [A41.9] Altered mental status, unspecified altered mental status type [R41.82]  DISCHARGE DIAGNOSIS:  Active Problems:   Sepsis ([place]) PNA  SECONDARY DIAGNOSIS:   Past Medical History:  Diagnosis Date  . Anginal pain ([place])   . Anxiety   . 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 Coronary artery disease   . Diabetes mellitus without complication ([place])   . HOH (hard of hearing)    wears hearing aids  . Hypercholesteremia   . Hypertension   . Peripheral vascular disease ([place])   . Shortness of breath dyspnea    orthopnea uses 2 pillows    HOSPITAL COURSE:   HPI  Aaron Garcia  is a 77 y.o. male with a known history of memory loss.  For the last 2 days he has been a little more sleepy groggy and confused and incoherent.  Today he went for a teeth cleaning today.  He has been getting weaker and weaker.  He has been having crazy dreams and lashing out.  He has been having trouble making a sentence.  In the ER, he was found to have a temperature of 103.  Not much other complaints.  Slight cough at night  1. Clinical sepsis with fever, leukocytosis and suspected pneumonia with slight cough.  Clinically improved Repeat chest x-ray with a pneumonia versus atelectasis Incentive spirometry Provided hydration with IV fluids Empiric antibiotics with vancomycin and cefepime.  MRSA PCR negative discontinued vancomycin.  Discharge patient home with [facility] for 7 days with probiotics Since the patient had dental work on September 16, 2017 need to rule out endocarditis.  Normal echocardiogram with 60 to 65% ejection  fraction Lactic acid 1.5 in the normal range and procalcitonin trending down. Urine culture with multiple species which is a contaminant  2. Acute encephalopathy likely secondary to sepsis and fever.  CT scan of the head is negative.  MRI of the brain is negative.  Patient is more awake and alert today Continue home medication aspirin and Plavix  3. History of CAD on Imdur Plavix and aspirin Metoprolol on hold secondary to soft blood pressure  4. Memory loss on donepezil  5. Hyperlipidemia unspecified on Lipitor. Check a lipid profile op  Generalized weakness discharge home with home health PT The patient and his wife at bedside are agreeable  DISCHARGE CONDITIONS:   STABLE  CONSULTS OBTAINED:     PROCEDURES NONE   DRUG ALLERGIES:  No Known Allergies  DISCHARGE MEDICATIONS:   Allergies as of 09/18/2017   No Known Allergies     Medication List    TAKE these medications   acetaminophen 325 MG tablet Commonly known as:  TYLENOL Take 2 tablets (650 mg total) by mouth every 6 (six) hours as needed for mild pain (or Fever >/= 101).   aspirin EC 81 MG tablet Take 81 mg by mouth every evening.   atorvastatin 80 MG tablet Commonly known as:  LIPITOR Take 80 mg by mouth daily at 6 PM.   cefdinir 300 MG capsule Commonly known as:  OMNICEF Take 1 capsule (300 mg total) by mouth every 12 (  twelve) hours.   clopidogrel 75 MG tablet Commonly known as:  PLAVIX Take 75 mg by mouth daily.   donepezil 10 MG tablet Commonly known as:  ARICEPT Take 10 mg by mouth daily at 2 PM.   isosorbide mononitrate 120 MG 24 hr tablet Commonly known as:  IMDUR Take 120 mg by mouth daily.   metoprolol succinate 50 MG 24 hr tablet Commonly known as:  TOPROL-XL Take 25 mg by mouth daily. Take with or immediately following a meal.   nitroGLYCERIN 0.4 MG SL tablet Commonly known as:  NITROSTAT Place 0.4 mg under the tongue every 5 (five) minutes as needed for chest pain.    saccharomyces boulardii 250 MG capsule Commonly known as:  FLORASTOR Take 1 capsule (250 mg total) by mouth 2 (two) times daily.        DISCHARGE INSTRUCTIONS:  Follow-up with primary care physician in 3 to 4 days Home health PT  DIET:  Cardiac diet  DISCHARGE CONDITION:  Stable  ACTIVITY:  Activity as tolerated  OXYGEN:  Home Oxygen: No.   Oxygen Delivery: room air  DISCHARGE LOCATION:  home   If you experience worsening of your admission symptoms, develop shortness of breath, life threatening emergency, suicidal or homicidal thoughts you must seek medical attention immediately by calling 911 or calling your MD immediately  if symptoms less severe.  You Must read complete instructions/literature along with all the possible adverse reactions/side effects for all the Medicines you take and that have been prescribed to you. Take any new Medicines after you have completely understood and accpet all the possible adverse reactions/side effects.   Please note  You were cared for by a hospitalist during your hospital stay. If you have any questions about your discharge medications or the care you received while you were in the hospital after you are discharged, you can call the unit and asked to speak with the hospitalist on call if the hospitalist that took care of you is not available. Once you are discharged, your primary care physician will handle any further medical issues. Please note that NO REFILLS for any discharge medications will be authorized once you are discharged, as it is imperative that you return to your primary care physician (or establish a relationship with a primary care physician if you do not have one) for your aftercare needs so that they can reassess your need for medications and monitor your lab values.     Today  Chief Complaint  Patient presents with  . Altered Mental Status   Is feeling much better.  Out of bed to chair.  Feeding himself.  Wife at  bedside.  Wants to go home  ROS:  CONSTITUTIONAL: Denies fevers, chills. Denies any fatigue, weakness.  EYES: Denies blurry vision, double vision, eye pain. EARS, NOSE, THROAT: Denies tinnitus, ear pain, hearing loss. RESPIRATORY: Denies cough, wheeze, shortness of breath.  CARDIOVASCULAR: Denies chest pain, palpitations, edema.  GASTROINTESTINAL: Denies nausea, vomiting, diarrhea, abdominal pain. Denies bright red blood per rectum. GENITOURINARY: Denies dysuria, hematuria. ENDOCRINE: Denies nocturia or thyroid problems. HEMATOLOGIC AND LYMPHATIC: Denies easy bruising or bleeding. SKIN: Denies rash or lesion. MUSCULOSKELETAL: Denies pain in neck, back, shoulder, knees, hips or arthritic symptoms.  NEUROLOGIC: Denies paralysis, paresthesias.  PSYCHIATRIC: Denies anxiety or depressive symptoms.   VITAL SIGNS:  Blood pressure 118/64, pulse 61, temperature 98.1 F (36.7 C), temperature source Oral, resp. rate 18, height 5\' 10"  (1.778 m), weight 82.2 kg, SpO2 97 %.  I/O:  Intake/Output Summary (Last 24 hours) at 09/18/2017 1148 Last data filed at 09/18/2017 1116 Gross per 24 hour  Intake 2844.71 ml  Output 2125 ml  Net 719.71 ml    PHYSICAL EXAMINATION:  GENERAL:  77 y.o.-year-old patient lying in the bed with no acute distress.  EYES: Pupils equal, round, reactive to light and accommodation. No scleral icterus. Extraocular muscles intact.  HEENT: Head atraumatic, normocephalic. Oropharynx and nasopharynx clear.  NECK:  Supple, no jugular venous distention. No thyroid enlargement, no tenderness.  LUNGS: Normal breath sounds bilaterally, no wheezing, rales,rhonchi or crepitation. No use of accessory muscles of respiration.  CARDIOVASCULAR: S1, S2 normal. No murmurs, rubs, or gallops.  ABDOMEN: Soft, non-tender, non-distended. Bowel sounds present. No organomegaly or mass.  EXTREMITIES: No pedal edema, cyanosis, or clubbing.  NEUROLOGIC: Cranial nerves II through XII are intact.  Muscle strength 5/5 in all extremities. Sensation intact. Gait not checked.  PSYCHIATRIC: The patient is alert and oriented x 3.  SKIN: No obvious rash, lesion, or ulcer.   DATA REVIEW:   CBC Recent Labs  Lab 09/18/17 0507  WBC 9.2  HGB 11.9*  HCT 35.5*  PLT 199    Chemistries  Recent Labs  Lab 09/16/17 1830 09/17/17 0319  NA 137 142  K 3.8 3.6  CL 102 106  CO2 28 29  GLUCOSE 125* 170*  BUN 23 20  CREATININE 1.15 1.04  CALCIUM 9.1 8.4*  AST 26  --   ALT 22  --   ALKPHOS 77  --   BILITOT 1.1  --     Cardiac Enzymes No results for input(s): TROPONINI in the last 168 hours.  Microbiology Results  Results for orders placed or performed during the hospital encounter of 09/16/17  Culture, blood (Routine x 2)     Status: None (Preliminary result)   Collection Time: 09/16/17  6:26 PM  Result Value Ref Range Status   Specimen Description BLOOD BLOOD RIGHT HAND  Final   Special Requests   Final    BOTTLES DRAWN AEROBIC AND ANAEROBIC Blood Culture adequate volume   Culture   Final    NO GROWTH 2 DAYS Performed at [facility], [address], [place], [place] [address]    Report Status PENDING  Incomplete  Culture, blood (Routine x 2)     Status: None (Preliminary result)   Collection Time: 09/16/17  6:51 PM  Result Value Ref Range Status   Specimen Description BLOOD BLOOD RIGHT HAND  Final   Special Requests   Final    BOTTLES DRAWN AEROBIC AND ANAEROBIC Blood Culture adequate volume   Culture   Final    NO GROWTH 2 DAYS Performed at [facility], [address] [place], [place] [address]    Report Status PENDING  Incomplete  Urine culture     Status: Abnormal   Collection Time: 09/16/17  6:51 PM  Result Value Ref Range Status   Specimen Description   Final    URINE, RANDOM Performed at [facility], [address] [place], [place] [address]    Special Requests   Final    NONE Performed at [facility], [address] [place], [place] [address]    Culture MULTIPLE SPECIES PRESENT, SUGGEST RECOLLECTION (A)  Final   Report Status 09/18/2017 FINAL  Final  MRSA PCR Screening     Status: None   Collection Time: 09/16/17 11:15 PM  Result Value Ref Range Status   MRSA by PCR NEGATIVE  NEGATIVE Final    Comment:        The GeneXpert MRSA Assay (FDA approved for NASAL specimens only), is one component of a comprehensive MRSA colonization surveillance program. It is not intended to diagnose MRSA infection nor to guide or monitor treatment for MRSA infections. Performed at [facility], [address] [place],  [address]     RADIOLOGY:  Dg Chest 2 View  Result Date: 09/17/2017 CLINICAL DATA:  Sepsis EXAM: CHEST - 2 VIEW COMPARISON:  History day FINDINGS: Lower volumes with patchy opacity at the bases. Blunting of the lateral right costophrenic sulcus. No pneumothorax. Stable normal heart size for technique. IMPRESSION: Lower volumes with atelectasis or pneumonia at the bases. Electronically Signed   By: Monte Fantasia M.D.   On: 09/17/2017 09:03   Dg Chest 2 View  Result Date: 09/16/2017 CLINICAL DATA:  Altered mental status EXAM: CHEST - 2 VIEW COMPARISON:  None. FINDINGS: Blunting of the right hemidiaphragm with suspected small right pleural effusion. No focal airspace consolidation or pulmonary edema. Linear opacities in the left mid lung. No pneumothorax. IMPRESSION: Small right pleural effusion with associated atelectasis. Electronically Signed   By: Ulyses Jarred M.D.   On: 09/16/2017 19:27   Ct Head Wo Contrast  Result Date: 09/16/2017 CLINICAL DATA:  Altered mental status of unclear cause. Patient has been more sleepy than normal and confused when awakened. EXAM: CT HEAD WITHOUT CONTRAST TECHNIQUE: Contiguous axial images were obtained from the base of the skull through the vertex without intravenous contrast. COMPARISON:  None. FINDINGS: BRAIN: There is sulcal and ventricular  prominence consistent with superficial and central atrophy. No intraparenchymal hemorrhage, mass effect nor midline shift. Periventricular and subcortical white matter hypodensities consistent with chronic small vessel ischemic disease are identified. No acute large vascular territory infarcts. No abnormal extra-axial fluid collections. Basal cisterns are not effaced and midline. Idiopathic bilateral basal ganglial calcifications are seen. VASCULAR: Moderate calcific atherosclerosis of the carotid siphons and distal vertebral arteries. SKULL: No skull fracture. No significant scalp soft tissue swelling. SINUSES/ORBITS: The mastoid air-cells are clear. The included paranasal sinuses are well-aerated.The included ocular globes and orbital contents are non-suspicious. OTHER: None. IMPRESSION: Atrophy with chronic appearing small vessel ischemia. No acute intracranial abnormality. Electronically Signed   By: Ashley Royalty M.D.   On: 09/16/2017 21:06   Mr Brain Wo Contrast  Result Date: 09/17/2017 CLINICAL DATA:  Altered mental status for 2 days, progressive weakness. Febrile. History of memory loss, hypertension, hypercholesterolemia, diabetes. EXAM: MRI HEAD WITHOUT CONTRAST TECHNIQUE: Multiplanar, multiecho pulse sequences of the brain and surrounding structures were obtained without intravenous contrast. COMPARISON:  CT HEAD September 16, 2017 FINDINGS: INTRACRANIAL CONTENTS: No reduced diffusion to suggest acute ischemia. No susceptibility artifact to suggest hemorrhage. The ventricles and sulci are normal for patient's age. Patchy supratentorial white matter FLAIR T2 hyperintensities compatible with mild chronic small vessel ischemic changes, normal for age. No suspicious parenchymal signal, masses, mass effect. No abnormal extra-axial fluid collections. No extra-axial masses. VASCULAR: Normal major intracranial vascular flow voids present at skull base. SKULL AND UPPER CERVICAL SPINE: No abnormal sellar expansion.  No suspicious calvarial bone marrow signal. Craniocervical junction maintained. SINUSES/ORBITS: The mastoid air-cells and included paranasal sinuses are well-aerated.The included ocular globes and orbital contents are non-suspicious. Status post bilateral ocular lens implants. OTHER: None. IMPRESSION: 1. Normal noncontrast MRI head for age. Electronically Signed   By: Elon Alas M.D.   On: 09/17/2017 01:06    EKG:   Orders  placed or performed during the hospital encounter of 09/16/17  . ED EKG 12-Lead  . ED EKG 12-Lead  . EKG 12-Lead  . EKG 12-Lead      Management plans discussed with the patient, family and they are in agreement.  CODE STATUS:     Code Status Orders  (From admission, onward)         Start     Ordered   09/16/17 2029  Full code  Continuous     09/16/17 2028        Code Status History    This patient has a current code status but no historical code status.    Advance Directive Documentation     Most Recent Value  Type of Advance Directive  Out of facility DNR (pink MOST or yellow form), Healthcare Power of Attorney  Pre-existing out of facility DNR order (yellow form or pink MOST form)  Pink MOST form placed in chart (order not valid for inpatient use)  "MOST" Form in Place?  -      TOTAL TIME TAKING CARE OF THIS PATIENT: 43  minutes.   Note: This dictation was prepared with Dragon dictation along with smaller phrase technology. Any transcriptional errors that result from this process are unintentional.   @M[facility] @  on 09/18/2017 at 11:48 AM  Between 7am to 6pm - Pager - [phone]  After 6pm go to [email or web address] - password [email or web address] [facility]  Office  [phone]  CC: Primary care physician; [place]

## 2017-09-18 NOTE — Discharge Instructions (Signed)
[facility] Follow-up with primary care physician in 3 to 4 days

## 2017-09-20 DIAGNOSIS — Z9181 History of falling: Secondary | ICD-10-CM | POA: Diagnosis not present

## 2017-09-20 DIAGNOSIS — H9193 Unspecified hearing loss, bilateral: Secondary | ICD-10-CM | POA: Diagnosis not present

## 2017-09-20 DIAGNOSIS — Z85828 Personal history of other malignant neoplasm of skin: Secondary | ICD-10-CM | POA: Diagnosis not present

## 2017-09-20 DIAGNOSIS — J189 Pneumonia, unspecified organism: Secondary | ICD-10-CM | POA: Diagnosis not present

## 2017-09-20 DIAGNOSIS — Z7902 Long term (current) use of antithrombotics/antiplatelets: Secondary | ICD-10-CM | POA: Diagnosis not present

## 2017-09-20 DIAGNOSIS — I10 Essential (primary) hypertension: Secondary | ICD-10-CM | POA: Diagnosis not present

## 2017-09-20 DIAGNOSIS — Z87891 Personal history of nicotine dependence: Secondary | ICD-10-CM | POA: Diagnosis not present

## 2017-09-20 DIAGNOSIS — A419 Sepsis, unspecified organism: Secondary | ICD-10-CM | POA: Diagnosis not present

## 2017-09-20 DIAGNOSIS — E7800 Pure hypercholesterolemia, unspecified: Secondary | ICD-10-CM | POA: Diagnosis not present

## 2017-09-20 DIAGNOSIS — Z7982 Long term (current) use of aspirin: Secondary | ICD-10-CM | POA: Diagnosis not present

## 2017-09-20 DIAGNOSIS — I2510 Atherosclerotic heart disease of native coronary artery without angina pectoris: Secondary | ICD-10-CM | POA: Diagnosis not present

## 2017-09-20 DIAGNOSIS — E1151 Type 2 diabetes mellitus with diabetic peripheral angiopathy without gangrene: Secondary | ICD-10-CM | POA: Diagnosis not present

## 2017-09-21 ENCOUNTER — Telehealth: Payer: Self-pay

## 2017-09-21 DIAGNOSIS — E7800 Pure hypercholesterolemia, unspecified: Secondary | ICD-10-CM | POA: Diagnosis not present

## 2017-09-21 DIAGNOSIS — I10 Essential (primary) hypertension: Secondary | ICD-10-CM | POA: Diagnosis not present

## 2017-09-21 DIAGNOSIS — A419 Sepsis, unspecified organism: Secondary | ICD-10-CM | POA: Diagnosis not present

## 2017-09-21 DIAGNOSIS — E1151 Type 2 diabetes mellitus with diabetic peripheral angiopathy without gangrene: Secondary | ICD-10-CM | POA: Diagnosis not present

## 2017-09-21 DIAGNOSIS — J189 Pneumonia, unspecified organism: Secondary | ICD-10-CM | POA: Diagnosis not present

## 2017-09-21 DIAGNOSIS — I2510 Atherosclerotic heart disease of native coronary artery without angina pectoris: Secondary | ICD-10-CM | POA: Diagnosis not present

## 2017-09-21 LAB — CULTURE, BLOOD (ROUTINE X 2)
CULTURE: NO GROWTH
Culture: NO GROWTH
Special Requests: ADEQUATE
Special Requests: ADEQUATE

## 2017-09-21 NOTE — Telephone Encounter (Signed)
EMMI Follow-up: Noted on the report that the patient did not have a scheduled follow-up appointment.  I called and Mrs. Aaron Garcia said she would relay a message to her husband since he wasn't available.  I asked if they had read over the discharge paperwork and she said yes but hadn't made an appointment with the [name] as she has him scheduled to see a new PCP next Wednesday. He is doing well and no needs noted for today.  I let her know there would be a second automated call with a different series of questions and to let us know if he had any concerns at that time.

## 2017-09-26 ENCOUNTER — Telehealth: Payer: Self-pay | Admitting: Licensed Clinical Social Worker

## 2017-09-26 NOTE — Telephone Encounter (Signed)
EMMI flagged patient for answering yes to feeling sad/hopeless/anxious/empty. Clinical Education officer, museum (CSW) contacted patient via telephone and his wife Joaquim Lai answered. Per wife she has been taking the follow up phone calls for patient. Per wife patient is doing okay and had his follow up appointment at the [facility] today. Per wife patient is not feeling sad or depressed. Wife stated that patient is a little anxious. CSW made wife aware that the [facility] has mental health and counseling services available. Wife verbalized her understanding and reported no needs or concerns.   [employer], LCSW [phone]

## 2017-10-06 DIAGNOSIS — J189 Pneumonia, unspecified organism: Secondary | ICD-10-CM | POA: Diagnosis not present

## 2017-10-06 DIAGNOSIS — A419 Sepsis, unspecified organism: Secondary | ICD-10-CM | POA: Diagnosis not present

## 2017-10-06 DIAGNOSIS — E1151 Type 2 diabetes mellitus with diabetic peripheral angiopathy without gangrene: Secondary | ICD-10-CM | POA: Diagnosis not present

## 2017-10-06 DIAGNOSIS — I10 Essential (primary) hypertension: Secondary | ICD-10-CM | POA: Diagnosis not present

## 2017-10-06 DIAGNOSIS — E7800 Pure hypercholesterolemia, unspecified: Secondary | ICD-10-CM | POA: Diagnosis not present

## 2017-10-06 DIAGNOSIS — I2510 Atherosclerotic heart disease of native coronary artery without angina pectoris: Secondary | ICD-10-CM | POA: Diagnosis not present

## 2017-10-07 DIAGNOSIS — I10 Essential (primary) hypertension: Secondary | ICD-10-CM | POA: Diagnosis not present

## 2017-10-07 DIAGNOSIS — E7800 Pure hypercholesterolemia, unspecified: Secondary | ICD-10-CM | POA: Diagnosis not present

## 2017-10-07 DIAGNOSIS — I2510 Atherosclerotic heart disease of native coronary artery without angina pectoris: Secondary | ICD-10-CM | POA: Diagnosis not present

## 2017-10-07 DIAGNOSIS — J189 Pneumonia, unspecified organism: Secondary | ICD-10-CM | POA: Diagnosis not present

## 2017-10-07 DIAGNOSIS — E1151 Type 2 diabetes mellitus with diabetic peripheral angiopathy without gangrene: Secondary | ICD-10-CM | POA: Diagnosis not present

## 2017-10-07 DIAGNOSIS — A419 Sepsis, unspecified organism: Secondary | ICD-10-CM | POA: Diagnosis not present

## 2017-10-10 DIAGNOSIS — E7800 Pure hypercholesterolemia, unspecified: Secondary | ICD-10-CM | POA: Diagnosis not present

## 2017-10-10 DIAGNOSIS — I10 Essential (primary) hypertension: Secondary | ICD-10-CM | POA: Diagnosis not present

## 2017-10-10 DIAGNOSIS — I2510 Atherosclerotic heart disease of native coronary artery without angina pectoris: Secondary | ICD-10-CM | POA: Diagnosis not present

## 2017-10-10 DIAGNOSIS — E1151 Type 2 diabetes mellitus with diabetic peripheral angiopathy without gangrene: Secondary | ICD-10-CM | POA: Diagnosis not present

## 2017-10-10 DIAGNOSIS — J189 Pneumonia, unspecified organism: Secondary | ICD-10-CM | POA: Diagnosis not present

## 2017-10-10 DIAGNOSIS — A419 Sepsis, unspecified organism: Secondary | ICD-10-CM | POA: Diagnosis not present

## 2017-10-12 DIAGNOSIS — I10 Essential (primary) hypertension: Secondary | ICD-10-CM | POA: Diagnosis not present

## 2017-10-12 DIAGNOSIS — E1151 Type 2 diabetes mellitus with diabetic peripheral angiopathy without gangrene: Secondary | ICD-10-CM | POA: Diagnosis not present

## 2017-10-12 DIAGNOSIS — I2510 Atherosclerotic heart disease of native coronary artery without angina pectoris: Secondary | ICD-10-CM | POA: Diagnosis not present

## 2017-10-12 DIAGNOSIS — E7800 Pure hypercholesterolemia, unspecified: Secondary | ICD-10-CM | POA: Diagnosis not present

## 2017-10-12 DIAGNOSIS — A419 Sepsis, unspecified organism: Secondary | ICD-10-CM | POA: Diagnosis not present

## 2017-10-12 DIAGNOSIS — J189 Pneumonia, unspecified organism: Secondary | ICD-10-CM | POA: Diagnosis not present

## 2017-10-17 DIAGNOSIS — J189 Pneumonia, unspecified organism: Secondary | ICD-10-CM | POA: Diagnosis not present

## 2017-10-17 DIAGNOSIS — I2510 Atherosclerotic heart disease of native coronary artery without angina pectoris: Secondary | ICD-10-CM | POA: Diagnosis not present

## 2017-10-17 DIAGNOSIS — A419 Sepsis, unspecified organism: Secondary | ICD-10-CM | POA: Diagnosis not present

## 2017-10-17 DIAGNOSIS — I10 Essential (primary) hypertension: Secondary | ICD-10-CM | POA: Diagnosis not present

## 2017-10-17 DIAGNOSIS — E7800 Pure hypercholesterolemia, unspecified: Secondary | ICD-10-CM | POA: Diagnosis not present

## 2017-10-17 DIAGNOSIS — E1151 Type 2 diabetes mellitus with diabetic peripheral angiopathy without gangrene: Secondary | ICD-10-CM | POA: Diagnosis not present

## 2017-10-18 DIAGNOSIS — E1151 Type 2 diabetes mellitus with diabetic peripheral angiopathy without gangrene: Secondary | ICD-10-CM | POA: Diagnosis not present

## 2017-10-18 DIAGNOSIS — J189 Pneumonia, unspecified organism: Secondary | ICD-10-CM | POA: Diagnosis not present

## 2017-10-18 DIAGNOSIS — I2510 Atherosclerotic heart disease of native coronary artery without angina pectoris: Secondary | ICD-10-CM | POA: Diagnosis not present

## 2017-10-18 DIAGNOSIS — E7800 Pure hypercholesterolemia, unspecified: Secondary | ICD-10-CM | POA: Diagnosis not present

## 2017-10-18 DIAGNOSIS — I10 Essential (primary) hypertension: Secondary | ICD-10-CM | POA: Diagnosis not present

## 2017-10-18 DIAGNOSIS — A419 Sepsis, unspecified organism: Secondary | ICD-10-CM | POA: Diagnosis not present

## 2017-10-24 DIAGNOSIS — I208 Other forms of angina pectoris: Secondary | ICD-10-CM | POA: Diagnosis not present

## 2017-10-24 DIAGNOSIS — R400 Somnolence: Secondary | ICD-10-CM | POA: Diagnosis not present

## 2017-10-24 DIAGNOSIS — R252 Cramp and spasm: Secondary | ICD-10-CM | POA: Diagnosis not present

## 2017-10-24 DIAGNOSIS — J181 Lobar pneumonia, unspecified organism: Secondary | ICD-10-CM | POA: Diagnosis not present

## 2017-10-24 DIAGNOSIS — I25118 Atherosclerotic heart disease of native coronary artery with other forms of angina pectoris: Secondary | ICD-10-CM | POA: Diagnosis not present

## 2017-12-29 DIAGNOSIS — J90 Pleural effusion, not elsewhere classified: Secondary | ICD-10-CM | POA: Diagnosis not present

## 2017-12-29 DIAGNOSIS — J181 Lobar pneumonia, unspecified organism: Secondary | ICD-10-CM | POA: Diagnosis not present

## 2017-12-29 DIAGNOSIS — H6123 Impacted cerumen, bilateral: Secondary | ICD-10-CM | POA: Diagnosis not present

## 2018-01-13 DIAGNOSIS — J181 Lobar pneumonia, unspecified organism: Secondary | ICD-10-CM | POA: Diagnosis not present

## 2018-01-13 DIAGNOSIS — J90 Pleural effusion, not elsewhere classified: Secondary | ICD-10-CM | POA: Diagnosis not present

## 2018-02-15 DIAGNOSIS — R413 Other amnesia: Secondary | ICD-10-CM | POA: Diagnosis not present

## 2018-02-15 DIAGNOSIS — F09 Unspecified mental disorder due to known physiological condition: Secondary | ICD-10-CM | POA: Diagnosis not present

## 2018-02-15 DIAGNOSIS — R419 Unspecified symptoms and signs involving cognitive functions and awareness: Secondary | ICD-10-CM | POA: Diagnosis not present

## 2018-02-24 DIAGNOSIS — G253 Myoclonus: Secondary | ICD-10-CM | POA: Diagnosis not present

## 2018-02-24 DIAGNOSIS — G20 Parkinson's disease: Secondary | ICD-10-CM | POA: Diagnosis not present

## 2018-02-24 DIAGNOSIS — R400 Somnolence: Secondary | ICD-10-CM | POA: Diagnosis not present

## 2018-02-24 DIAGNOSIS — Z9181 History of falling: Secondary | ICD-10-CM | POA: Diagnosis not present

## 2018-02-24 DIAGNOSIS — F0390 Unspecified dementia without behavioral disturbance: Secondary | ICD-10-CM | POA: Diagnosis not present

## 2018-03-15 DIAGNOSIS — G20 Parkinson's disease: Secondary | ICD-10-CM | POA: Diagnosis not present

## 2018-03-15 DIAGNOSIS — R262 Difficulty in walking, not elsewhere classified: Secondary | ICD-10-CM | POA: Diagnosis not present

## 2018-03-15 DIAGNOSIS — M25611 Stiffness of right shoulder, not elsewhere classified: Secondary | ICD-10-CM | POA: Diagnosis not present

## 2018-03-15 DIAGNOSIS — M25652 Stiffness of left hip, not elsewhere classified: Secondary | ICD-10-CM | POA: Diagnosis not present

## 2018-03-15 DIAGNOSIS — M25651 Stiffness of right hip, not elsewhere classified: Secondary | ICD-10-CM | POA: Diagnosis not present

## 2018-03-15 DIAGNOSIS — M25612 Stiffness of left shoulder, not elsewhere classified: Secondary | ICD-10-CM | POA: Diagnosis not present

## 2018-03-16 DIAGNOSIS — H43813 Vitreous degeneration, bilateral: Secondary | ICD-10-CM | POA: Diagnosis not present

## 2018-03-20 DIAGNOSIS — R262 Difficulty in walking, not elsewhere classified: Secondary | ICD-10-CM | POA: Diagnosis not present

## 2018-03-20 DIAGNOSIS — G20 Parkinson's disease: Secondary | ICD-10-CM | POA: Diagnosis not present

## 2018-03-20 DIAGNOSIS — M25652 Stiffness of left hip, not elsewhere classified: Secondary | ICD-10-CM | POA: Diagnosis not present

## 2018-03-20 DIAGNOSIS — M25612 Stiffness of left shoulder, not elsewhere classified: Secondary | ICD-10-CM | POA: Diagnosis not present

## 2018-03-20 DIAGNOSIS — M25651 Stiffness of right hip, not elsewhere classified: Secondary | ICD-10-CM | POA: Diagnosis not present

## 2018-03-20 DIAGNOSIS — M25611 Stiffness of right shoulder, not elsewhere classified: Secondary | ICD-10-CM | POA: Diagnosis not present

## 2018-03-31 DIAGNOSIS — G253 Myoclonus: Secondary | ICD-10-CM | POA: Diagnosis not present

## 2018-03-31 DIAGNOSIS — F0390 Unspecified dementia without behavioral disturbance: Secondary | ICD-10-CM | POA: Diagnosis not present

## 2018-03-31 DIAGNOSIS — G20 Parkinson's disease: Secondary | ICD-10-CM | POA: Diagnosis not present

## 2018-04-01 DIAGNOSIS — F0390 Unspecified dementia without behavioral disturbance: Secondary | ICD-10-CM | POA: Diagnosis not present

## 2018-04-01 DIAGNOSIS — G20 Parkinson's disease: Secondary | ICD-10-CM | POA: Diagnosis not present

## 2018-04-01 DIAGNOSIS — G253 Myoclonus: Secondary | ICD-10-CM | POA: Diagnosis not present

## 2018-04-02 DIAGNOSIS — G253 Myoclonus: Secondary | ICD-10-CM | POA: Diagnosis not present

## 2018-04-02 DIAGNOSIS — F0390 Unspecified dementia without behavioral disturbance: Secondary | ICD-10-CM | POA: Diagnosis not present

## 2018-04-02 DIAGNOSIS — G20 Parkinson's disease: Secondary | ICD-10-CM | POA: Diagnosis not present

## 2018-07-17 DIAGNOSIS — J929 Pleural plaque without asbestos: Secondary | ICD-10-CM | POA: Diagnosis not present

## 2018-07-17 DIAGNOSIS — J181 Lobar pneumonia, unspecified organism: Secondary | ICD-10-CM | POA: Diagnosis not present

## 2018-07-17 DIAGNOSIS — R911 Solitary pulmonary nodule: Secondary | ICD-10-CM | POA: Diagnosis not present

## 2018-08-23 DIAGNOSIS — I25118 Atherosclerotic heart disease of native coronary artery with other forms of angina pectoris: Secondary | ICD-10-CM | POA: Diagnosis not present

## 2018-08-23 DIAGNOSIS — Z0000 Encounter for general adult medical examination without abnormal findings: Secondary | ICD-10-CM | POA: Diagnosis not present

## 2018-08-23 DIAGNOSIS — I10 Essential (primary) hypertension: Secondary | ICD-10-CM | POA: Diagnosis not present

## 2018-08-23 DIAGNOSIS — R7303 Prediabetes: Secondary | ICD-10-CM | POA: Diagnosis not present

## 2018-10-05 ENCOUNTER — Other Ambulatory Visit: Payer: Self-pay

## 2018-10-05 DIAGNOSIS — Z20822 Contact with and (suspected) exposure to covid-19: Secondary | ICD-10-CM

## 2018-10-06 LAB — NOVEL CORONAVIRUS, NAA: SARS-CoV-2, NAA: NOT DETECTED

## 2018-11-10 DIAGNOSIS — Z9181 History of falling: Secondary | ICD-10-CM | POA: Diagnosis not present

## 2018-11-10 DIAGNOSIS — F0390 Unspecified dementia without behavioral disturbance: Secondary | ICD-10-CM | POA: Diagnosis not present

## 2018-11-10 DIAGNOSIS — G20 Parkinson's disease: Secondary | ICD-10-CM | POA: Diagnosis not present

## 2018-11-10 DIAGNOSIS — G253 Myoclonus: Secondary | ICD-10-CM | POA: Diagnosis not present

## 2018-11-29 DIAGNOSIS — L219 Seborrheic dermatitis, unspecified: Secondary | ICD-10-CM | POA: Diagnosis not present

## 2018-11-29 DIAGNOSIS — Z85828 Personal history of other malignant neoplasm of skin: Secondary | ICD-10-CM | POA: Diagnosis not present

## 2018-11-29 DIAGNOSIS — Z08 Encounter for follow-up examination after completed treatment for malignant neoplasm: Secondary | ICD-10-CM | POA: Diagnosis not present

## 2018-11-29 DIAGNOSIS — L720 Epidermal cyst: Secondary | ICD-10-CM | POA: Diagnosis not present

## 2018-11-29 DIAGNOSIS — L570 Actinic keratosis: Secondary | ICD-10-CM | POA: Diagnosis not present

## 2018-11-29 DIAGNOSIS — D225 Melanocytic nevi of trunk: Secondary | ICD-10-CM | POA: Diagnosis not present

## 2018-11-29 DIAGNOSIS — L821 Other seborrheic keratosis: Secondary | ICD-10-CM | POA: Diagnosis not present

## 2019-02-11 ENCOUNTER — Other Ambulatory Visit: Payer: Self-pay

## 2019-02-11 ENCOUNTER — Ambulatory Visit
Admission: EM | Admit: 2019-02-11 | Discharge: 2019-02-11 | Disposition: A | Discharge status: Home / Self Care | Payer: Medicare Other | Attending: Family Medicine | Admitting: Family Medicine

## 2019-02-11 ENCOUNTER — Ambulatory Visit (INDEPENDENT_AMBULATORY_CARE_PROVIDER_SITE_OTHER): Payer: Medicare Other

## 2019-02-11 ENCOUNTER — Encounter: Payer: Self-pay | Admitting: Emergency Medicine

## 2019-02-11 DIAGNOSIS — R443 Hallucinations, unspecified: Secondary | ICD-10-CM

## 2019-02-11 DIAGNOSIS — J90 Pleural effusion, not elsewhere classified: Secondary | ICD-10-CM

## 2019-02-11 LAB — URINALYSIS, COMPLETE (UACMP) WITH MICROSCOPIC
Bilirubin Urine: NEGATIVE
Glucose, UA: NEGATIVE mg/dL
Leukocytes,Ua: NEGATIVE
Nitrite: NEGATIVE
Protein, ur: NEGATIVE mg/dL
Specific Gravity, Urine: 1.025 (ref 1.005–1.030)
Squamous Epithelial / LPF: NONE SEEN (ref 0–5)
pH: 5.5 (ref 5.0–8.0)

## 2019-02-11 LAB — COMPREHENSIVE METABOLIC PANEL
ALT: 19 U/L (ref 0–44)
AST: 16 U/L (ref 15–41)
Albumin: 4.2 g/dL (ref 3.5–5.0)
Alkaline Phosphatase: 83 U/L (ref 38–126)
Anion gap: 9 (ref 5–15)
BUN: 30 mg/dL — ABNORMAL HIGH (ref 8–23)
CO2: 28 mmol/L (ref 22–32)
Calcium: 9.5 mg/dL (ref 8.9–10.3)
Chloride: 104 mmol/L (ref 98–111)
Creatinine, Ser: 1.38 mg/dL — ABNORMAL HIGH (ref 0.61–1.24)
GFR calc Af Amer: 56 mL/min — ABNORMAL LOW (ref >60)
GFR calc non Af Amer: 49 mL/min — ABNORMAL LOW (ref >60)
Glucose, Bld: 113 mg/dL — ABNORMAL HIGH (ref 70–99)
Potassium: 4.3 mmol/L (ref 3.5–5.1)
Sodium: 141 mmol/L (ref 135–145)
Total Bilirubin: 0.7 mg/dL (ref 0.3–1.2)
Total Protein: 7.9 g/dL (ref 6.5–8.1)

## 2019-02-11 LAB — CBC WITH DIFFERENTIAL/PLATELET
Abs Immature Granulocytes: 0.03 10*3/uL (ref 0.00–0.07)
Basophils Absolute: 0.1 10*3/uL (ref 0.0–0.1)
Basophils Relative: 1 %
Eosinophils Absolute: 0.3 10*3/uL (ref 0.0–0.5)
Eosinophils Relative: 3 %
HCT: 40.6 % (ref 39.0–52.0)
Hemoglobin: 13.2 g/dL (ref 13.0–17.0)
Immature Granulocytes: 0 %
Lymphocytes Relative: 14 %
Lymphs Abs: 1.1 10*3/uL (ref 0.7–4.0)
MCH: 30.1 pg (ref 26.0–34.0)
MCHC: 32.5 g/dL (ref 30.0–36.0)
MCV: 92.7 fL (ref 80.0–100.0)
Monocytes Absolute: 0.8 10*3/uL (ref 0.1–1.0)
Monocytes Relative: 10 %
Neutro Abs: 5.8 10*3/uL (ref 1.7–7.7)
Neutrophils Relative %: 72 %
Platelets: 240 10*3/uL (ref 150–400)
RBC: 4.38 MIL/uL (ref 4.22–5.81)
RDW: 12.5 % (ref 11.5–15.5)
WBC: 8 10*3/uL (ref 4.0–10.5)
nRBC: 0 % (ref 0.0–0.2)

## 2019-02-11 MED ORDER — LEVOFLOXACIN 750 MG PO TABS: 750.0000 mg | ORAL_TABLET | ORAL | 0 refills | Status: AC

## 2019-02-11 NOTE — ED Triage Notes (Signed)
Wife is accompany patient.  Wife states that her husband a been up all night and has had some hallucination with seeing and talking to people that are not there.  Wife states that he is more lethargic.  Wife states that he has had urinary urgency but denies pain.

## 2019-02-11 NOTE — ED Provider Notes (Signed)
[facility]    CSN: [id number] Arrival date & time: 02/11/19  [id number]      History   Chief Complaint Change in status/Hallucinations  HPI  79 year old male presents for evaluation regarding the above.  Wife states that he has not been his normal self for the past 2 days.  Last night he was up all night and had difficulty sleeping.  He was experiencing hallucinations and was talking to people that were not there.  Wife states that he has been getting more lethargic than he normally is.  He is sleeping most of the day.  She states that she has seen a steady decline over the past few months.  His gait is getting worse, he is sleeping more, he has had an approximate 9 pound weight loss since September.  Given the hallucinations last night and difficulty sleeping, wife concerned about potential underlying acute process that is exacerbating his underlying dementia.  He has a history of lobar pneumonia.  Wife concerned about possible UTI.  She reports that he has had some urinary urgency but no other urinary symptoms.  No cough, fever, shortness of breath.  No other reported symptoms.  No other complaints.  PMH, Surgical Hx, Family Hx, Social History reviewed and updated as below.  Past Medical History:  Diagnosis Date  . Anginal pain ([place])   . Anxiety   . 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 Coronary artery disease   . Diabetes mellitus without complication ([place])   . HOH (hard of hearing)    wears hearing aids  . Hypercholesteremia   . Hypertension   . Peripheral vascular disease ([place])   . Shortness of breath dyspnea    orthopnea uses 2 pillows    Patient Active Problem List   Diagnosis Date Noted  . Sepsis ([place]) 09/16/2017    Past Surgical History:  Procedure Laterality Date  . APPENDECTOMY    . CARDIAC CATHETERIZATION     No Stents  . CATARACT EXTRACTION W/PHACO Left 06/17/2015   Procedure: CATARACT EXTRACTION PHACO AND INTRAOCULAR LENS PLACEMENT (IOC);   Surgeon: Birder Robson, MD;  Location: [facility] ORS;  Service: Ophthalmology;  Laterality: Left;  [place] 47.5 AP% 20.9 CDE 9.92 Fluid Pack lot # [id number]  . CATARACT EXTRACTION W/PHACO Right 07/15/2015   Procedure: CATARACT EXTRACTION PHACO AND INTRAOCULAR LENS PLACEMENT (IOC);  Surgeon: Birder Robson, MD;  Location: [facility] ORS;  Service: Ophthalmology;  Laterality: Right;  [place] 01:06 AP% 19.5 CDE 13.04 fluid pack lot # [id number]  . HERNIA REPAIR    . LEG TENDON SURGERY    . MOUTH SURGERY     Home Medications    Prior to Admission medications   Medication Sig Start Date End Date Taking? Authorizing Provider  aspirin EC 81 MG tablet Take 81 mg by mouth every evening.   Yes [provider]  atorvastatin (LIPITOR) 80 MG tablet Take 80 mg by mouth daily at 6 PM.   Yes [provider]  clopidogrel (PLAVIX) 75 MG tablet Take 75 mg by mouth daily.   Yes [provider]  donepezil (ARICEPT) 10 MG tablet Take 10 mg by mouth daily at 2 PM.   Yes [provider]  isosorbide mononitrate (IMDUR) 120 MG 24 hr tablet Take 120 mg by mouth daily.   Yes [provider]  memantine (NAMENDA) 10 MG tablet Take by mouth. 11/10/18  Yes [provider]  metoprolol succinate (TOPROL-XL) 50 MG 24 hr tablet Take  25 mg by mouth daily. Take with or immediately following a meal.   Yes [provider]  saccharomyces boulardii (FLORASTOR) 250 MG capsule Take 1 capsule (250 mg total) by mouth 2 (two) times daily. 09/18/17  Yes Gouru, Illene Silver, MD  acetaminophen (TYLENOL) 325 MG tablet Take 2 tablets (650 mg total) by mouth every 6 (six) hours as needed for mild pain (or Fever >/= 101). 09/18/17   Nicholes Mango, MD  levofloxacin (LEVAQUIN) 750 MG tablet Take 1 tablet (750 mg total) by mouth every other day. 02/11/19   Coral Spikes, DO  nitroGLYCERIN (NITROSTAT) 0.4 MG SL tablet Place 0.4 mg under the tongue every 5 (five) minutes as needed for chest pain.    [provider]    Family History Family History  Problem Relation Age of Onset  . Hodgkin's lymphoma Father   . Memory loss Sister   . Alzheimer's disease Brother     Social History Social History   Tobacco Use  . Smoking status: Former Research scientist (life sciences)  . Smokeless tobacco: Never Used  Substance Use Topics  . Alcohol use: No  . Drug use: No     Allergies   Patient has no known allergies.   Review of Systems Review of Systems  Constitutional: Positive for fatigue. Negative for fever.  Respiratory: Negative.   Psychiatric/Behavioral: Positive for hallucinations and sleep disturbance.   Physical Exam Triage Vital Signs ED Triage Vitals [02/11/19 0925]  Enc Vitals Group     BP 129/69     Pulse Rate 67     Resp 16     Temp 98.2 F (36.8 C)     Temp Source Oral     SpO2 99 %     Weight 169 lb (76.7 kg)     Height 5\' 9"  (1.753 m)     Head Circumference      Peak Flow      Pain Score 0     Pain Loc      Pain Edu?      Excl. in [place]?    Updated Vital Signs BP 129/69 (BP Location: Right Arm)   Pulse 67   Temp 98.2 F (36.8 C) (Oral)   Resp 16   Ht 5\' 9"  (1.753 m)   Wt 76.7 kg   SpO2 99%   BMI 24.96 kg/m   Visual Acuity Right Eye Distance:   Left Eye Distance:   Bilateral Distance:    Right Eye Near:   Left Eye Near:    Bilateral Near:     Physical Exam Constitutional:      General: He is not in acute distress.    Appearance: Normal appearance. He is not ill-appearing.  HENT:     Head: Normocephalic and atraumatic.     Mouth/Throat:     Mouth: Mucous membranes are moist.     Pharynx: Oropharynx is clear. No oropharyngeal exudate or posterior oropharyngeal erythema.  Eyes:     General:        Right eye: No discharge.        Left eye: No discharge.     Conjunctiva/sclera: Conjunctivae normal.  Cardiovascular:     Rate and Rhythm: Normal rate and regular rhythm.     Heart sounds: No murmur.  Pulmonary:     Effort: Pulmonary effort is normal.      Breath sounds: Normal breath sounds. No wheezing, rhonchi or rales.  Abdominal:     General: There is no distension.  Palpations: Abdomen is soft.     Tenderness: There is no abdominal tenderness.  Skin:    General: Skin is warm.     Findings: No rash.  Neurological:     Mental Status: He is alert.     Comments: Shuffled gait noted.  Patient has expressive aphasia. Bradykinesia.   Psychiatric:        Behavior: Behavior normal.     Comments: Flat affect/blunted affect.    UC Treatments / Results  Labs (all labs ordered are listed, but only abnormal results are displayed) Labs Reviewed  URINALYSIS, COMPLETE (UACMP) WITH MICROSCOPIC - Abnormal; Notable for the following components:      Result Value   Hgb urine dipstick TRACE (*)    Ketones, ur TRACE (*)    Bacteria, UA RARE (*)    All other components within normal limits  COMPREHENSIVE METABOLIC PANEL - Abnormal; Notable for the following components:   Glucose, Bld 113 (*)    BUN 30 (*)    Creatinine, Ser 1.38 (*)    GFR calc non Af Amer 49 (*)    GFR calc Af Amer 56 (*)    All other components within normal limits  CBC WITH DIFFERENTIAL/PLATELET    EKG   Radiology DG Chest 2 View  Result Date: 02/11/2019 CLINICAL DATA:  Altered mental status.  Hallucinations. EXAM: CHEST - 2 VIEW COMPARISON:  09/17/2017 FINDINGS: Small right pleural effusion. Mild right basilar atelectasis or scarring. No focal consolidation. No left pleural effusion. No pneumothorax. Linear band of airspace disease in the lingula likely reflecting scarring. Normal cardiomediastinal silhouette. No aggressive osseous lesion. IMPRESSION: Small right pleural effusion. Mild right basilar atelectasis or scarring. Electronically Signed   By: Kathreen Devoid   On: 02/11/2019 10:00    Procedures Procedures (including critical care time)  Medications Ordered in UC Medications - No data to display  Initial Impression / Assessment and Plan / UC Course  I  have reviewed the triage vital signs and the nursing notes.  Pertinent labs & imaging results that were available during my care of the patient were reviewed by me and considered in my medical decision making (see chart for details).    79 year old male presents with recent lethargy and hallucinations.  Wife concerned given ongoing decline and recent worsening change.  Concern for underlying cause.  Work-up today revealed pleural effusion noted on chest x-ray.  Etiology and prognosis uncertain at this time.  Remainder laboratory work-up unrevealing.  Placing empirically on Levaquin to cover for potential bacterial source/parapneumonic effusion.  Levaquin was renally dosed.  Advised wife that he needs fairly quick follow-up this week and will likely need referral to pulmonology or an order placed to have effusion drained for further analysis.  Final Clinical Impressions(s) / UC Diagnoses   Final diagnoses:  Pleural effusion  Hallucinations     Discharge Instructions     Antibiotic as prescribed.  Needs follow up this coming week. I am happy to refer to pulmonology if needed (let me know).  Take care  Dr. Lacinda Axon     ED Prescriptions    Medication Sig Dispense Auth. Provider   levofloxacin (LEVAQUIN) 750 MG tablet Take 1 tablet (750 mg total) by mouth every other day. 4 tablet Coral Spikes, DO     PDMP not reviewed this encounter.   Coral Spikes, DO 02/11/19 1045

## 2019-02-11 NOTE — Discharge Instructions (Signed)
Antibiotic as prescribed.  Needs follow up this coming week. I am happy to refer to pulmonology if needed (let me know).  Take care  Dr. Lacinda Axon

## 2019-04-24 ENCOUNTER — Encounter (INDEPENDENT_AMBULATORY_CARE_PROVIDER_SITE_OTHER): Payer: Self-pay

## 2019-11-19 DEATH — deceased

## 2020-05-25 IMAGING — CR DG CHEST 2V
1 series · 2 of 2 positions shown · non-contrast
Comparison: None.

CLINICAL DATA: Altered mental status

EXAM:
CHEST - 2 VIEW

[Series 1: dg chest 2 view · 0.14mm/px · 2 of 2 slices shown]
[im 1/2]
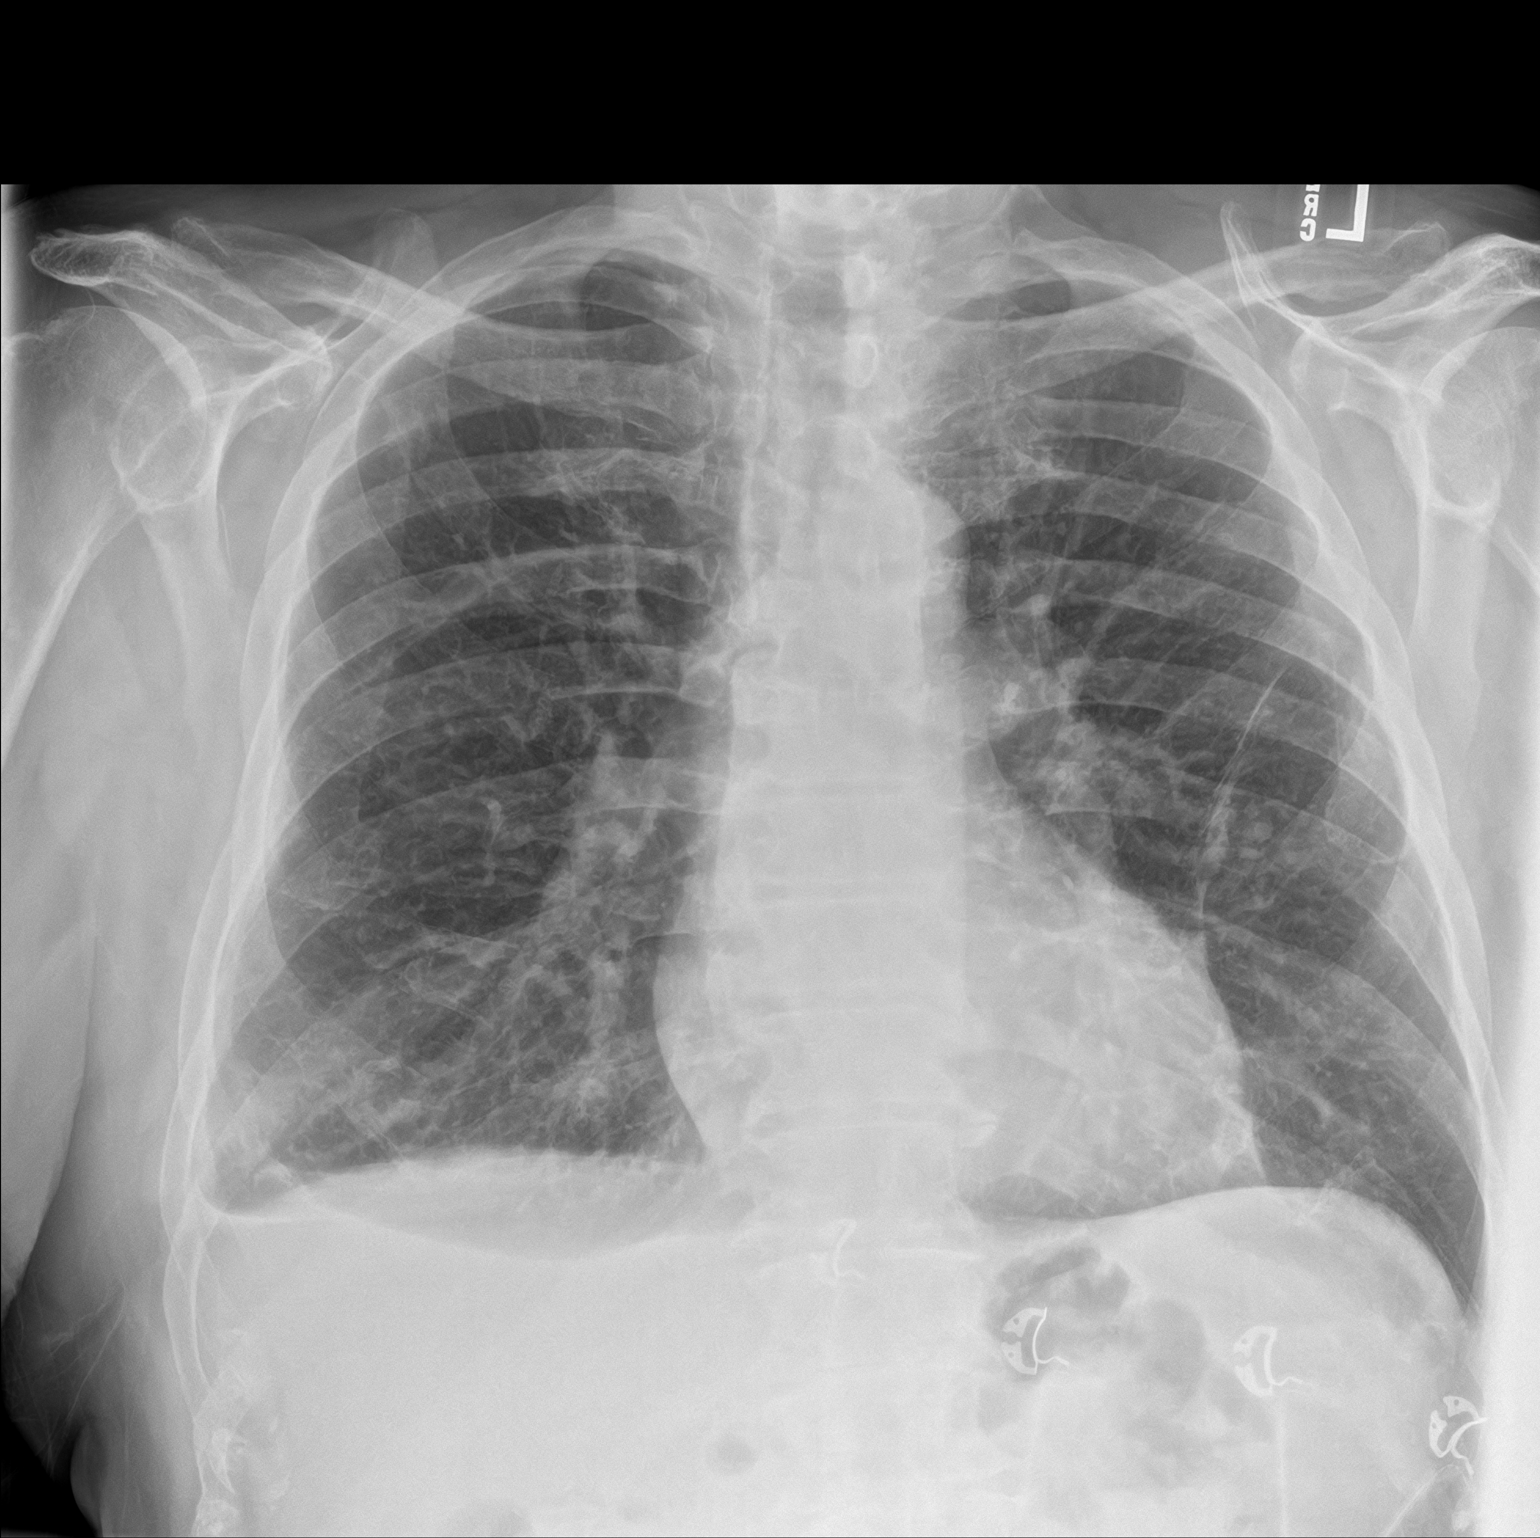
[im 2/2]
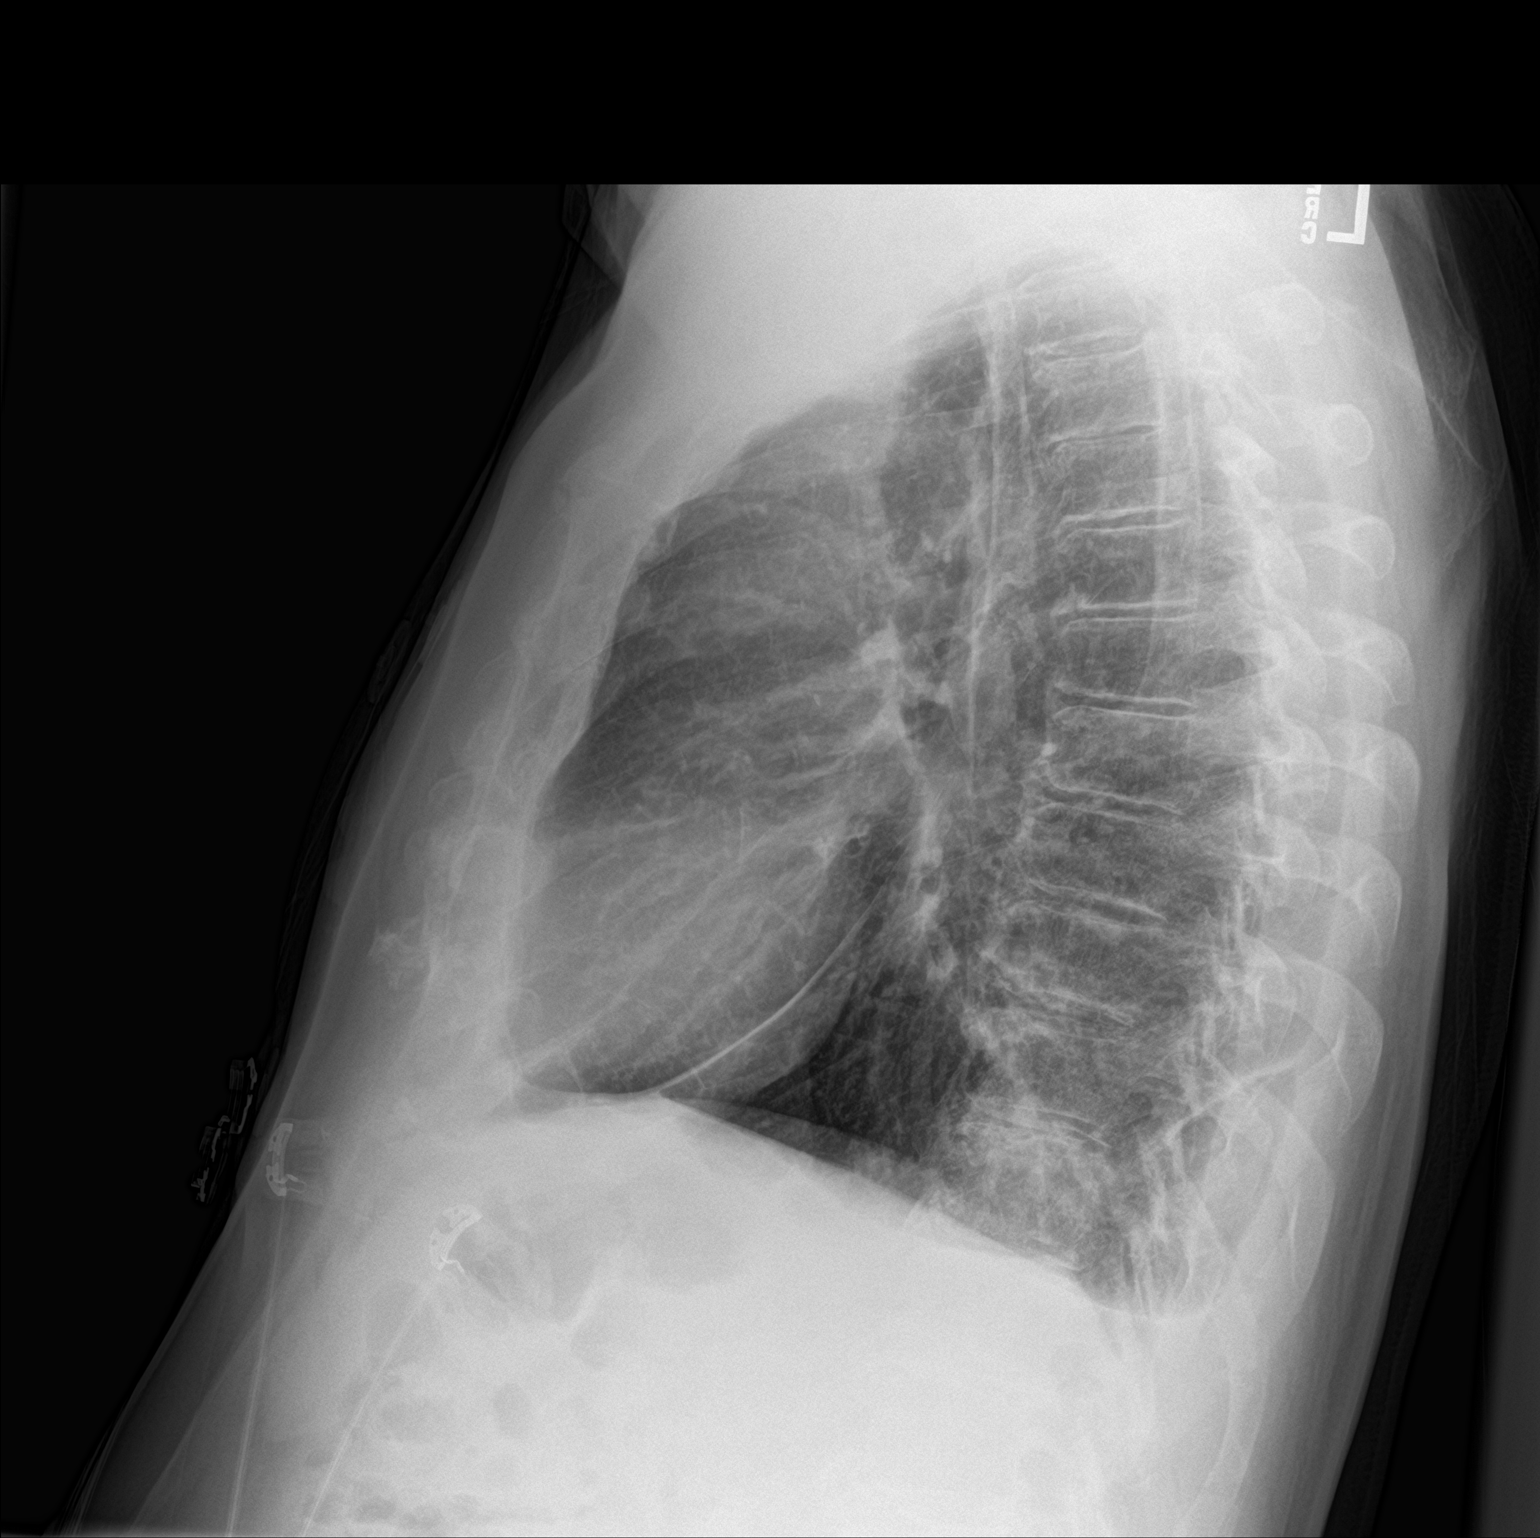

[2 of 2 positions shown; findings below may reference images not displayed]

FINDINGS: Blunting of the right hemidiaphragm with suspected small right
pleural effusion. No focal airspace consolidation or pulmonary
edema. Linear opacities in the left mid lung. No pneumothorax.
IMPRESSION: Small right pleural effusion with associated atelectasis.

## 2020-05-26 IMAGING — CR DG CHEST 2V
1 series · 2 of 2 positions shown · non-contrast
Comparison: History day

CLINICAL DATA: Sepsis

EXAM:
CHEST - 2 VIEW

[Series 1: dg chest 2 view · 0.14mm/px · 2 of 2 slices shown]
[im 1/2]
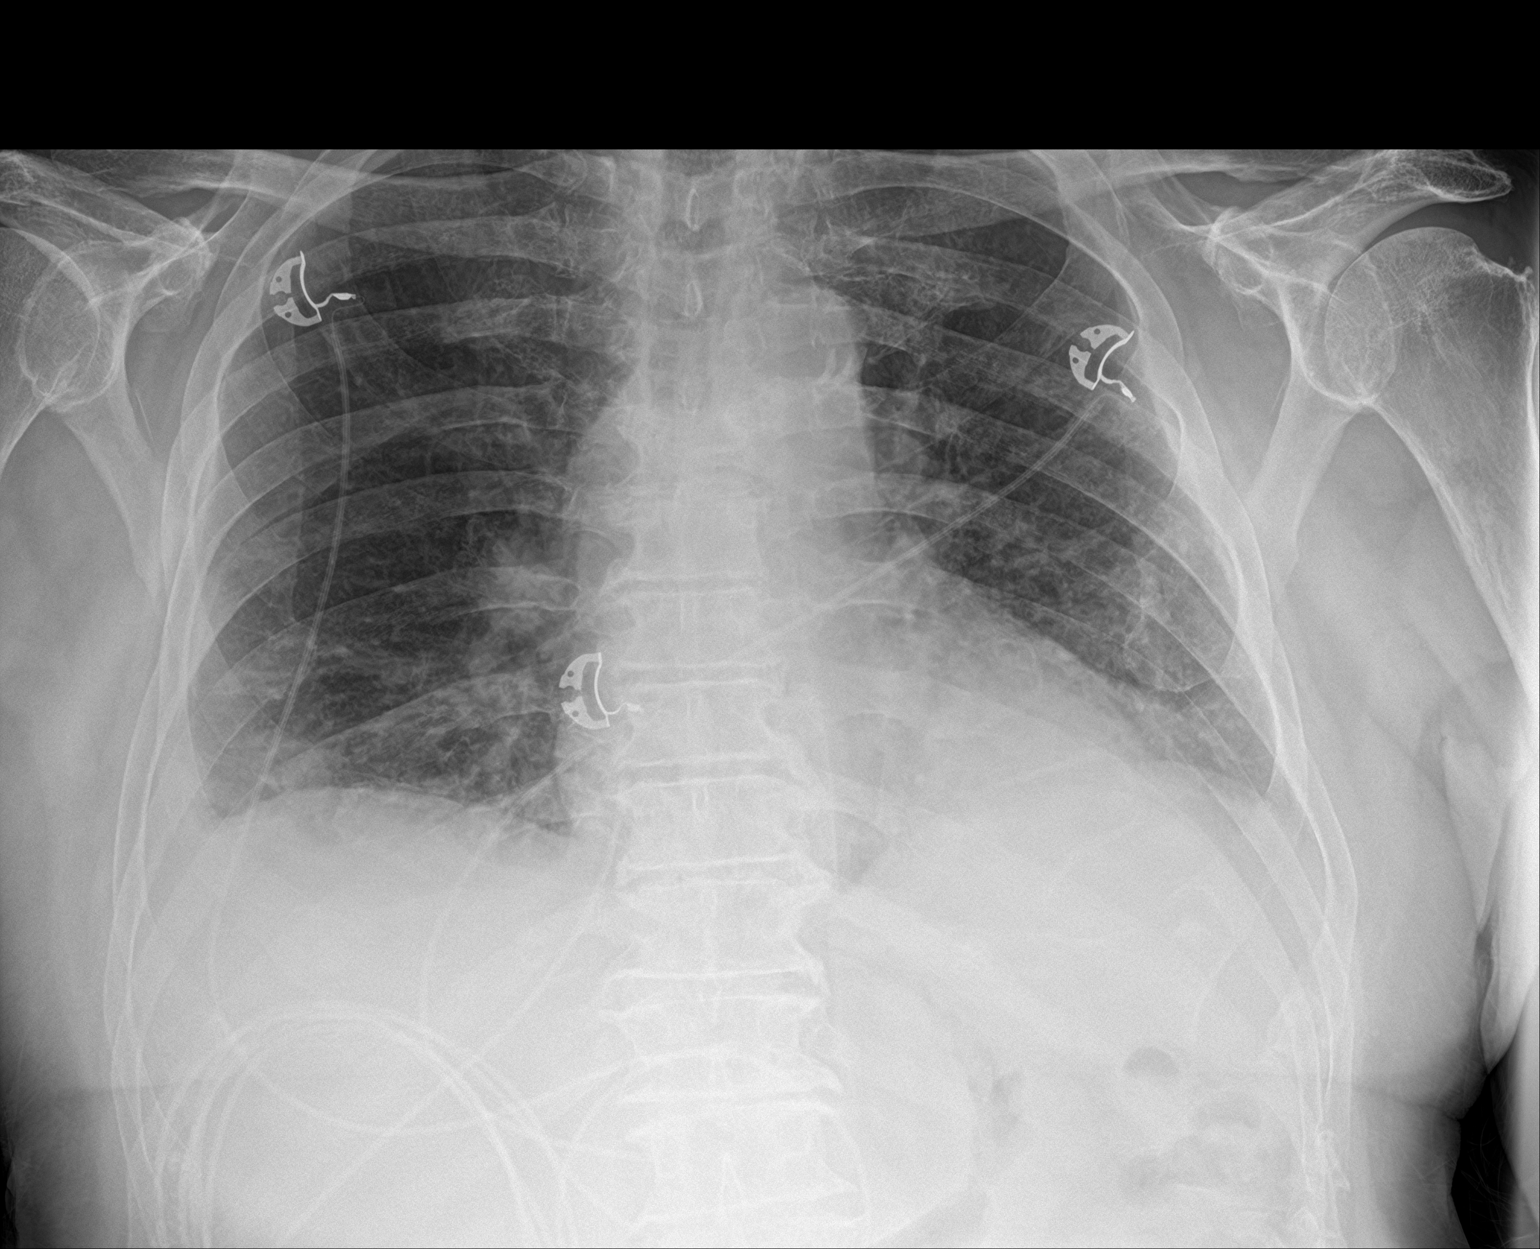
[im 2/2]
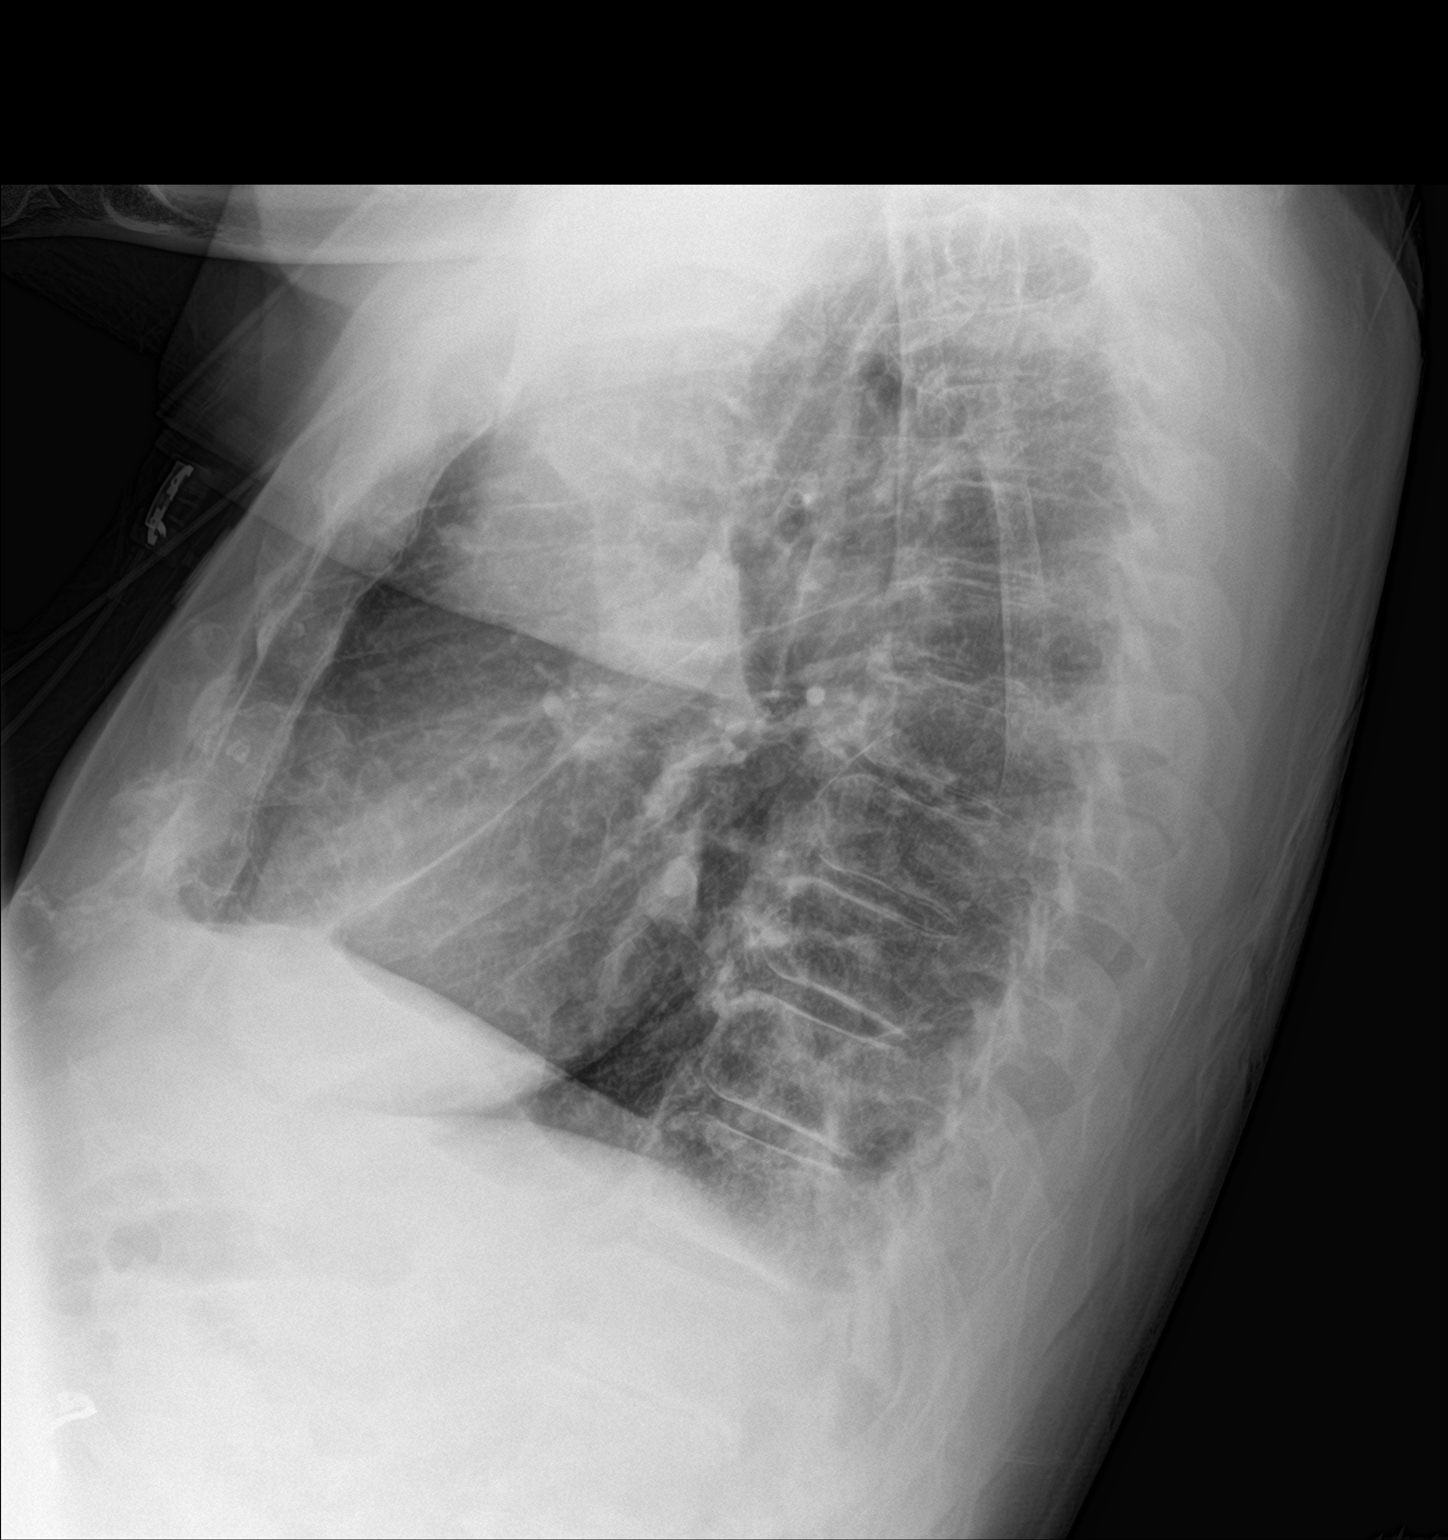

[2 of 2 positions shown; findings below may reference images not displayed]

FINDINGS: Lower volumes with patchy opacity at the bases. Blunting of the
lateral right costophrenic sulcus. No pneumothorax. Stable normal
heart size for technique.
IMPRESSION: Lower volumes with atelectasis or pneumonia at the bases.
